# Patient Record
Sex: Male | Born: 1950 | Race: White | Hispanic: No | Marital: Single | Smoking: Never smoker
Health system: Southern US, Community
[De-identification: ages and names within clinical notes are randomized; demographics above are authoritative.]

## PROBLEM LIST (undated history)

## (undated) DIAGNOSIS — J939 Pneumothorax, unspecified: Secondary | ICD-10-CM

## (undated) DIAGNOSIS — C801 Malignant (primary) neoplasm, unspecified: Secondary | ICD-10-CM

## (undated) DIAGNOSIS — I82409 Acute embolism and thrombosis of unspecified deep veins of unspecified lower extremity: Secondary | ICD-10-CM

## (undated) HISTORY — PX: OTHER SURGICAL HISTORY: SHX169

## (undated) HISTORY — DX: Pneumothorax, unspecified: J93.9

## (undated) HISTORY — PX: CYSTOSCOPY: SUR368

## (undated) HISTORY — DX: Malignant (primary) neoplasm, unspecified: C80.1

## (undated) HISTORY — PX: BLADDER TUMOR EXCISION: SHX238

## (undated) HISTORY — DX: Acute embolism and thrombosis of unspecified deep veins of unspecified lower extremity: I82.409

---

## 1975-04-23 DIAGNOSIS — J939 Pneumothorax, unspecified: Secondary | ICD-10-CM

## 1975-04-23 HISTORY — DX: Pneumothorax, unspecified: J93.9

## 1999-12-06 ENCOUNTER — Encounter: Admission: RE | Admit: 1999-12-06 | Discharge: 1999-12-06 | Payer: Self-pay | Admitting: Sports Medicine

## 1999-12-21 ENCOUNTER — Encounter: Admission: RE | Admit: 1999-12-21 | Discharge: 1999-12-21 | Payer: Self-pay | Admitting: Family Medicine

## 2000-01-07 ENCOUNTER — Encounter: Admission: RE | Admit: 2000-01-07 | Discharge: 2000-01-07 | Payer: Self-pay | Admitting: Sports Medicine

## 2000-05-23 ENCOUNTER — Encounter: Admission: RE | Admit: 2000-05-23 | Discharge: 2000-05-23 | Payer: Self-pay | Admitting: Sports Medicine

## 2000-05-27 ENCOUNTER — Encounter: Payer: Self-pay | Admitting: Sports Medicine

## 2000-05-27 ENCOUNTER — Encounter: Admission: RE | Admit: 2000-05-27 | Discharge: 2000-05-27 | Payer: Self-pay | Admitting: Sports Medicine

## 2000-08-18 ENCOUNTER — Encounter: Admission: RE | Admit: 2000-08-18 | Discharge: 2000-08-18 | Payer: Self-pay | Admitting: Sports Medicine

## 2002-09-24 ENCOUNTER — Encounter: Admission: RE | Admit: 2002-09-24 | Discharge: 2002-09-24 | Payer: Self-pay | Admitting: Family Medicine

## 2002-09-24 ENCOUNTER — Encounter: Payer: Self-pay | Admitting: Family Medicine

## 2003-04-19 ENCOUNTER — Emergency Department (HOSPITAL_COMMUNITY): Admission: EM | Admit: 2003-04-19 | Discharge: 2003-04-20 | Payer: Self-pay | Admitting: Emergency Medicine

## 2005-09-25 ENCOUNTER — Ambulatory Visit: Payer: Self-pay | Admitting: Internal Medicine

## 2005-10-07 ENCOUNTER — Ambulatory Visit: Payer: Self-pay | Admitting: Gastroenterology

## 2006-12-19 ENCOUNTER — Ambulatory Visit: Payer: Self-pay | Admitting: Sports Medicine

## 2006-12-19 DIAGNOSIS — K219 Gastro-esophageal reflux disease without esophagitis: Secondary | ICD-10-CM

## 2006-12-19 DIAGNOSIS — M7750 Other enthesopathy of unspecified foot: Secondary | ICD-10-CM | POA: Insufficient documentation

## 2007-04-08 ENCOUNTER — Ambulatory Visit: Payer: Self-pay | Admitting: Sports Medicine

## 2007-04-08 DIAGNOSIS — R5383 Other fatigue: Secondary | ICD-10-CM

## 2007-04-08 DIAGNOSIS — R5381 Other malaise: Secondary | ICD-10-CM

## 2007-04-08 DIAGNOSIS — J392 Other diseases of pharynx: Secondary | ICD-10-CM | POA: Insufficient documentation

## 2007-05-19 ENCOUNTER — Ambulatory Visit: Payer: Self-pay | Admitting: Sports Medicine

## 2007-07-03 ENCOUNTER — Ambulatory Visit: Payer: Self-pay | Admitting: Sports Medicine

## 2007-07-03 DIAGNOSIS — J301 Allergic rhinitis due to pollen: Secondary | ICD-10-CM

## 2007-07-21 ENCOUNTER — Ambulatory Visit: Payer: Self-pay | Admitting: Family Medicine

## 2007-07-21 ENCOUNTER — Ambulatory Visit (HOSPITAL_COMMUNITY): Admission: RE | Admit: 2007-07-21 | Discharge: 2007-07-21 | Payer: Self-pay | Admitting: Sports Medicine

## 2008-01-26 ENCOUNTER — Encounter: Admission: RE | Admit: 2008-01-26 | Discharge: 2008-01-26 | Payer: Self-pay | Admitting: Family Medicine

## 2010-01-23 ENCOUNTER — Ambulatory Visit: Payer: Self-pay | Admitting: Sports Medicine

## 2010-01-23 DIAGNOSIS — S838X9A Sprain of other specified parts of unspecified knee, initial encounter: Secondary | ICD-10-CM | POA: Insufficient documentation

## 2010-01-23 DIAGNOSIS — S86819A Strain of other muscle(s) and tendon(s) at lower leg level, unspecified leg, initial encounter: Secondary | ICD-10-CM

## 2010-05-22 NOTE — Assessment & Plan Note (Signed)
Summary: PULLED CALF MUSCLE,MC   Vital Signs:  Patient profile:   60 year old male Pulse rate:   58 / minute BP sitting:   119 / 75  (left arm)  Vitals Entered By: Rochele Pages RN (January 23, 2010 3:07 PM) CC: rt calf pain x 3.5 weeks   CC:  rt calf pain x 3.5 weeks.  History of Present Illness: 60yo male runner to office with c/o R calf pain x 3-4 weeks. Denies injury or trauma.  Denies feeling a pop or tearing sensation. No significant swelling or bruising. Has been limping at times b/c of pain. Was running 15 miles/wk when pain started, denies running any hills prior to injury.  No running in past 2-3 days b/c of pain.  Has been able to use stationary bike without pain. Taking advil as needed which helps. Denies any numbness or tingling.  Denies any previous calf injuries. Typically runs several 5k & 10k races.   Allergies: No Known Drug Allergies  Review of Systems      See HPI  Physical Exam  General:  Well-developed,well-nourished,in no acute distress; alert,appropriate and cooperative throughout examination Msk:  Right lower leg normal appearing without any deformity or atrophy.  No swelling or echymosis.  No TTP along gastroc or achilles.  No pain over calcaneous.  Normal ankle ROM without pain.  Normal ankle strength & stability.  Able to heel raise & toe walk without difficulty.  Thompson test reproduces plantar flexion.    FEET: arches well preserved.  No significant callous formation.  No TTP along PF or achilles bilaterally.  GAIT: no leg length difference.  Normal walking gait without a limp.  Good running form without limp. Pulses:  +2/4 lower ext b/l Neurologic:  sensation intact to light touch.   Additional Exam:  MSK U/S:  Exam of Rt calf reveals normal appearing gastroc without any edema or signs of tear.  Normal appearing achilles.  No other abnormalities noted.     Impression & Recommendations:  Problem # 1:  MUSCLE STRAIN, RIGHT CALF (ICD-844.8) -  Fitted with Body Helix calf sleeve to wear with activity and while working. - Heel lift placed in shoe to relieve stress on calf. - Educated on HEP with eccentric calf exercises - 3 sets of 15 - Ok to continue with stationary bike.  Can return to running as long as not having significant pain or limping. - Ice area after workouts - Offered Rx for NSAID, but pt prefers to cont. using Advil - f/u 3-4 weeks for re-evaluation, may return sooner if needed.  Orders: Garment,belt,sleeve or other covering ,elastic or similar stretch ([id number]) Foot Orthosis ( Arch Strap/Heel Cup) [phone])  Complete Medication List: 1)  Fluticasone Propionate 50 Mcg/act Susp (Fluticasone propionate) .... 2 sprays each nostril daily

## 2012-04-28 ENCOUNTER — Ambulatory Visit (INDEPENDENT_AMBULATORY_CARE_PROVIDER_SITE_OTHER): Payer: 59 | Admitting: Sports Medicine

## 2012-04-28 ENCOUNTER — Encounter: Payer: Self-pay | Admitting: Sports Medicine

## 2012-04-28 VITALS — BP 100/60 | Ht 70.0 in | Wt 152.0 lb

## 2012-04-28 DIAGNOSIS — IMO0002 Reserved for concepts with insufficient information to code with codable children: Secondary | ICD-10-CM

## 2012-04-28 DIAGNOSIS — S76319A Strain of muscle, fascia and tendon of the posterior muscle group at thigh level, unspecified thigh, initial encounter: Secondary | ICD-10-CM

## 2012-04-28 DIAGNOSIS — R319 Hematuria, unspecified: Secondary | ICD-10-CM | POA: Insufficient documentation

## 2012-04-28 NOTE — Assessment & Plan Note (Addendum)
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  This is not clearly just related to running and he has had several recurrences

## 2012-04-28 NOTE — Patient Instructions (Addendum)
It was good to see you today! Do the exercises we gave you today every day.  If you are not significantly better in 4 weeks, come back to see [place]. Urologist APPT: DR Berneice Heinrich [address] AT 145PM [phone]

## 2012-04-28 NOTE — Assessment & Plan Note (Addendum)
Presumptive diagnosis, likely mostly healed due to month of inactivity.  Will rx hamstring exercise program and compression with RTC 4 weeks if not improving  Given HEP with videos and handouts  Use compression for running or walking.

## 2012-04-28 NOTE — Progress Notes (Signed)
Subjective: The patient is a 62 y.o. year old male who presents today for left buttock pain.  Pain began about 6 months ago while running, although no defined injury.  Pain is located in low left buttock and is made worse by running, biking, or sitting.  No radiation.  Never had something like this before.  He was running more preparing for senior games.  Had gotten over most of his RT Achilles pain when the left buttocks pain started  Patient also reports that over the past several months he has had occasional problems with gross hematuria following runs.  This has happened 3-4 times and is new this year.  Denies any other urinary symptoms.  Patient's past medical, social, and family history were reviewed and updated as appropriate. History  Substance Use Topics  . Smoking status: Not on file  . Smokeless tobacco: Not on file  . Alcohol Use: Not on file   Objective:  Filed Vitals:   04/28/12 1424  BP: 100/60   Gen: NAD, thin, well muscled Left Hip: ROM IR: 80 Deg, ER: 80 Deg, Flexion: 120 Deg, Extension: 100 Deg, Abduction: 45 Deg, Adduction: 45 Deg Strength IR: 5/5, ER: 5/5, Flexion: 5/5, Extension: 5/5, Abduction: 5/5, Adduction: 5/5 Pelvic alignment unremarkable to inspection and palpation. Standing hip rotation and gait without trendelenburg / unsteadiness. Greater trochanter without tenderness to palpation. No tenderness over piriformis and greater trochanter. No SI joint tenderness and normal minimal SI movement. Mild tenderness over ischial tuberosity.  Patient's gait is slightly uneven with left push off slightly delayed.  No weakness or pain with strength testing of hamstring.  Assessment/Plan:  Please also see individual problems in problem list for problem-specific plans.

## 2012-05-22 ENCOUNTER — Other Ambulatory Visit: Payer: Self-pay | Admitting: Urology

## 2012-05-27 ENCOUNTER — Encounter (HOSPITAL_COMMUNITY): Payer: Self-pay | Admitting: Pharmacy Technician

## 2012-05-28 ENCOUNTER — Ambulatory Visit: Payer: 59 | Admitting: Sports Medicine

## 2012-05-28 ENCOUNTER — Inpatient Hospital Stay (HOSPITAL_COMMUNITY): Admission: RE | Admit: 2012-05-28 | Payer: 59 | Source: Ambulatory Visit

## 2012-06-02 ENCOUNTER — Ambulatory Visit (HOSPITAL_COMMUNITY): Admission: RE | Admit: 2012-06-02 | Payer: 59 | Source: Ambulatory Visit | Admitting: Urology

## 2012-06-02 ENCOUNTER — Encounter (HOSPITAL_COMMUNITY): Admission: RE | Payer: Self-pay | Source: Ambulatory Visit

## 2012-06-02 SURGERY — TURBT (TRANSURETHRAL RESECTION OF BLADDER TUMOR)
Anesthesia: General

## 2012-08-17 ENCOUNTER — Ambulatory Visit (INDEPENDENT_AMBULATORY_CARE_PROVIDER_SITE_OTHER): Payer: 59 | Admitting: Sports Medicine

## 2012-08-17 VITALS — Ht 70.0 in | Wt 148.0 lb

## 2012-08-17 DIAGNOSIS — M79609 Pain in unspecified limb: Secondary | ICD-10-CM

## 2012-08-17 DIAGNOSIS — M79661 Pain in right lower leg: Secondary | ICD-10-CM

## 2012-08-18 NOTE — Progress Notes (Signed)
  Subjective:    Patient ID: Donald Schmidt, male    DOB: 06-13-1950, 62 y.o.   MRN: [id number]  HPI chief complaint: Right calf pain  Pleasant 62 year old male comes in today complaining of 3 weeks of right calf pain. Pain began suddenly while walking. He injured this same calf last year. Was treated with compression and a home exercise program with complete resolution of symptoms. He describes a pulling sensation in the area of the medial gastroc but states that the pain at times can be a little more diffuse involving the entire lower leg. He was recently treated for bladder cancer. As part of his treatment he was on Cipro for February 6 through March 21st. he is wondering whether or not that may be playing a role in his current pain. He denies any associated numbness or tingling. His pain resolves immediately with rest.    Review of Systems     Objective:   Physical Exam Well-developed, well-nourished. No acute distress  Right calf: No obvious soft tissue swelling. No ecchymosis. Negative Homans. Mild tenderness to palpation in the mid calf area but no palpable defect. Slight pain with Achilles stretch. Achilles is intact. He does have palpable dorsalis pedis and posterior tibial pulses but the skin in the distal lower leg and ankle is somewhat shiny and hairless suggesting possible peripheral artery disease. Walks without a limp.  MSK ultrasound of the right calf: There is a mild amount of fluid along the midportion of the soleus but I see no discrete tear of the gastrocnemius nor the soleus muscle.       Assessment & Plan:  1. Right lower leg pain likely secondary to calf strain  The patient was on fluoroquinolones for several weeks and I do think that is playing a role in his current injury. It is well-known that fluoroquinolones can have an adverse effect on both tendon and muscle. I've given him a body helix compression sleeve and he is well-versed in the [place] heel drop  exercises. I've also given him a 5/16 inch heel lift (I've given him one for both shoes). Patient will followup with me in 3 weeks. If symptoms persist I may want to consider ABIs to rule out vascular claudication.

## 2012-08-20 ENCOUNTER — Encounter: Payer: Self-pay | Admitting: *Deleted

## 2012-08-20 NOTE — Patient Instructions (Signed)
PT REQUEST TO BE REFERRED FOR A SECOND OPINION. APPT WITH DR DALDORF MON MAY 12TH AT 415PM [address] [place] [phone]

## 2012-08-27 ENCOUNTER — Other Ambulatory Visit: Payer: Self-pay | Admitting: *Deleted

## 2012-08-27 DIAGNOSIS — M79609 Pain in unspecified limb: Secondary | ICD-10-CM

## 2012-09-07 ENCOUNTER — Ambulatory Visit: Payer: 59 | Admitting: Sports Medicine

## 2012-09-15 ENCOUNTER — Encounter: Payer: Self-pay | Admitting: Vascular Surgery

## 2012-09-16 ENCOUNTER — Encounter (INDEPENDENT_AMBULATORY_CARE_PROVIDER_SITE_OTHER): Payer: 59 | Admitting: *Deleted

## 2012-09-16 ENCOUNTER — Ambulatory Visit (INDEPENDENT_AMBULATORY_CARE_PROVIDER_SITE_OTHER): Payer: 59 | Admitting: Vascular Surgery

## 2012-09-16 ENCOUNTER — Encounter: Payer: Self-pay | Admitting: Vascular Surgery

## 2012-09-16 ENCOUNTER — Other Ambulatory Visit: Payer: Self-pay | Admitting: *Deleted

## 2012-09-16 VITALS — BP 117/69 | HR 52 | Resp 16 | Ht 70.5 in | Wt 143.4 lb

## 2012-09-16 DIAGNOSIS — I70219 Atherosclerosis of native arteries of extremities with intermittent claudication, unspecified extremity: Secondary | ICD-10-CM | POA: Insufficient documentation

## 2012-09-16 DIAGNOSIS — I739 Peripheral vascular disease, unspecified: Secondary | ICD-10-CM

## 2012-09-16 DIAGNOSIS — M79609 Pain in unspecified limb: Secondary | ICD-10-CM

## 2012-09-16 MED ORDER — NAPROXEN 250 MG PO TABS: 250.0000 mg | ORAL_TABLET | Freq: Two times a day (BID) | ORAL | Status: DC

## 2012-09-16 NOTE — Progress Notes (Signed)
[facility] [place]  Referred by:  Dr. Kirby Funk  Reason for referral: R calf claudication  History of Present Illness  Donald Schmidt is a 62 y.o. (09-Apr-1951) male w/ bladder cancer who presents with chief complaint: R calf pain.  Onset of symptom occurred, one year ago when he was found to have a calf injury.  He recovered from that and resumed running.  Over last couple of months, he has been have increased cramping in R calf with walking only 150 ft.  Pain is described as cramping, severity 3-6/10, and associated with walking.  Patient has attempted to treat this pain with rest and Ibuprofen.  The patient has not rest pain symptoms also and no leg wounds/ulcers.  Atherosclerotic risk factors include: none.  Past Medical History  Diagnosis Date  . Cancer     BLADDER    Past Surgical History  Procedure Laterality Date  . Bladder tumor excision      History   Social History  . Marital Status: Single    Spouse Name: N/A    Number of Children: N/A  . Years of Education: N/A   Occupational History  . Not on file.   Social History Main Topics  . Smoking status: Never Smoker   . Smokeless tobacco: Never Used  . Alcohol Use: Yes  . Drug Use: No  . Sexually Active: Not on file   Other Topics Concern  . Not on file   Social History Narrative  . No narrative on file    FamHx Parent have not contributing active medical problems  Current Outpatient Prescriptions on File Prior to Visit  Medication Sig Dispense Refill  . methocarbamol (ROBAXIN) 500 MG tablet Take 500 mg by mouth 3 (three) times daily.      [facility] CALCIUM 500 PO) Take 500 mg by mouth daily.       No current facility-administered medications on file prior to visit.    Allergies  Allergen Reactions  . Penicillins Rash     REVIEW OF SYSTEMS:  (Positives checked otherwise negative)  CARDIOVASCULAR:  [ ]  chest pain, [ ]  chest pressure, [ ]  palpitations, [ ]   shortness of breath when laying flat, [ ]  shortness of breath with exertion,   [x]  pain in feet when walking, [ ]  pain in feet when laying flat, [ ]  history of blood clot in veins (DVT), [ ]  history of phlebitis, [ ]  swelling in legs, [ ]  varicose veins  PULMONARY:  [ ]  productive cough, [ ]  asthma, [ ]  wheezing  NEUROLOGIC:  [ ]  weakness in arms or legs, [ ]  numbness in arms or legs, [ ]  difficulty speaking or slurred speech, [ ]  temporary loss of vision in one eye, [x]  dizziness  HEMATOLOGIC:  [x]  bleeding problems, [ ]  problems with blood clotting too easily  MUSCULOSKEL:  [ ]  joint pain, [ ]  joint swelling  GASTROINTEST:  [ ]   Vomiting blood, [ ]   Blood in stool     GENITOURINARY:  [ ]   Burning with urination, [x]   Blood in urine: related to bladder stent  PSYCHIATRIC:  [ ]  history of major depression  INTEGUMENTARY:  [ ]  rashes, [ ]  ulcers  CONSTITUTIONAL:  [ ]  fever, [ ]  chills  Physical Examination  Filed Vitals:   09/16/12 1400  BP: 117/69  Pulse: 52  Resp: 16  Height: 5' 10.5" (1.791 m)  Weight: 143 lb 6.4 oz (65.046 kg)  Body mass index is 20.28 kg/(m^2).  General: A&O x 3, WDWN  Head: [place]/AT  Ear/Nose/Throat: Hearing grossly intact, nares w/o erythema or drainage, oropharynx w/o Erythema/Exudate  Eyes: PERRLA, EOMI  Neck: Supple, no nuchal rigidity, no palpable LAD  Pulmonary: Sym exp, good air movt, CTAB, no rales, rhonchi, & wheezing  Cardiac: RRR, Nl S1, S2, no Murmurs, rubs or gallops  Vascular: Vessel Right Left  Radial Palpable Palpable  Ulnar Palpable Palpable  Brachial Palpable Palpable  Carotid Palpable, without bruit Palpable, without bruit  Aorta Not palpable N/A  Femoral Palpable Palpable  Popliteal Not palpable Palpable  PT Not Palpable Palpable  DP Not Palpable Palpable   Gastrointestinal: soft, NTND, -G/R, - HSM, - masses, - CVAT B, no AAA palpable  Musculoskeletal: M/S 5/5 throughout , Extremities without ischemic changes    Neurologic: CN 2-12 intact , Pain and light touch intact in extremities , Motor exam as listed above  Psychiatric: Judgment intact, Mood & affect appropriatefor pt's clinical situation  Dermatologic: See M/S exam for extremity exam, no rashes otherwise noted  Lymph : No Cervical, Axillary, or Inguinal lymphadenopathy   Non-Invasive Vascular Imaging  ABI (Date: 09/16/12)  RLE: 0.70, DP: mono, PT: mono, TBI no obtained due to pt movement  LLE: 1.13, DP: bi, PT: tri, TBI 0.49  R arterial duplex (Date: 09/16/12)  Patent B SFA with extensive diffuse calcific plaque  Occluded R popliteal artery  Only R PT visualized  L PT and peroneal patent  Outside Studies/Documentation 3 pages of outside documents were reviewed including: outside [facility] chart.  Medical Decision Making  [name] is a 62 y.o. male who presents with: RLE short distance lifestyle limiting intermittent claudication, BLE PAD   Given this patient's limited co-morbidities, it is a bit unusual for him to have significant BLE PAD.  He doesn't have sx and signs of popliteal entrapment, cystic adventital disease, and any vasculitis.  So I'm not certain the etiology of his R leg popliteal occlusion, this raises the concern for possible embolic disease in the setting of known PAD.  I discussed with the patient the natural history of intermittent claudication: 75% of patients have stable or improved symptoms in a year an only 2% require amputation. Eventually 20% may require intervention in a year.  I discussed in depth with the patient the nature of atherosclerosis, and emphasized the importance of maximal medical management including strict control of blood pressure, blood glucose, and lipid levels, antiplatelet agent, obtaining regular exercise, and cessation of smoking.    The patient is aware that without maximal medical management the underlying atherosclerotic disease process will progress, limiting the benefit  of any interventions.  I discussed in depth with the patient a walking plan and how to execute such.  Given the lifestyle limiting nature of his disease, I offered the patient: aortogram, bilateral leg runoff, and possible R leg intevention I discussed with the patient the nature of angiographic procedures, especially the limited patencies of any endovascular intervention.   The patient is aware of that the risks of an angiographic procedure include but are not limited to: bleeding, infection, access site complications, renal failure, embolization, rupture of vessel, dissection, possible need for emergent surgical intervention, possible need for surgical procedures to treat the patient's pathology, and stroke and death.   The patient is aware of the risks and is going to thinking it over.   I have him scheduled for repeat ABI in 6 months, otherwise.  Thank you for  allowing [place] to participate in this patient's care.  Leonides Sake, MD [facility] [place] Office: [phone] Pager: [phone]  09/16/2012, 6:13 PM

## 2012-09-17 ENCOUNTER — Encounter: Payer: Self-pay | Admitting: Vascular Surgery

## 2012-09-17 NOTE — Addendum Note (Signed)
Addended by: Adria Dill L on: 09/17/2012 02:41 PM   Modules accepted: Orders

## 2012-09-21 ENCOUNTER — Ambulatory Visit (INDEPENDENT_AMBULATORY_CARE_PROVIDER_SITE_OTHER): Payer: 59 | Admitting: Cardiovascular Disease

## 2012-09-21 ENCOUNTER — Encounter: Payer: Self-pay | Admitting: *Deleted

## 2012-09-21 ENCOUNTER — Telehealth: Payer: Self-pay | Admitting: Vascular Surgery

## 2012-09-21 ENCOUNTER — Encounter: Payer: Self-pay | Admitting: Cardiovascular Disease

## 2012-09-21 ENCOUNTER — Telehealth: Payer: Self-pay

## 2012-09-21 VITALS — BP 110/70 | HR 59 | Ht 70.5 in | Wt 155.5 lb

## 2012-09-21 DIAGNOSIS — I499 Cardiac arrhythmia, unspecified: Secondary | ICD-10-CM

## 2012-09-21 DIAGNOSIS — I4891 Unspecified atrial fibrillation: Secondary | ICD-10-CM

## 2012-09-21 DIAGNOSIS — I70201 Unspecified atherosclerosis of native arteries of extremities, right leg: Secondary | ICD-10-CM | POA: Insufficient documentation

## 2012-09-21 DIAGNOSIS — I743 Embolism and thrombosis of arteries of the lower extremities: Secondary | ICD-10-CM

## 2012-09-21 DIAGNOSIS — I749 Embolism and thrombosis of unspecified artery: Secondary | ICD-10-CM

## 2012-09-21 NOTE — Telephone Encounter (Signed)
Message copied by [facility], Sharine Cadle E on Mon Sep 21, 2012 11:05 AM ------      Message from: Lorine Bears A      Created: Mon Sep 21, 2012 10:40 AM      Regarding: Hematology       Refer him to hematology.       Either Dr. Arlan Organ (office in [facility])      Or Dr. Eli Hose (office in [place]).  ------

## 2012-09-21 NOTE — Telephone Encounter (Signed)
See below

## 2012-09-21 NOTE — Telephone Encounter (Signed)
Patient's questions were answered to his satisfaction.  He is still think over the possibility of right leg angiogram.

## 2012-09-21 NOTE — Patient Instructions (Addendum)
Your physician has requested that you have an echocardiogram. Echocardiography is a painless test that uses sound waves to create images of your heart. It provides your doctor with information about the size and shape of your heart and how well your heart's chambers and valves are working. This procedure takes approximately one hour. There are no restrictions for this procedure.  Your physician has recommended that you wear an event monitor. Event monitors are medical devices that record the heart's electrical activity. Doctors most often [place] these monitors to diagnose arrhythmias. Arrhythmias are problems with the speed or rhythm of the heartbeat. The monitor is a small, portable device. You can wear one while you do your normal daily activities. This is usually used to diagnose what is causing palpitations/syncope (passing out).- This will be mailed to your home from [facility].  We will call you with name of hematologist.

## 2012-09-21 NOTE — Progress Notes (Signed)
Primary care physician: Dr. Kirby Funk  HPI  This is a pleasant 62 year old man who was referred by Dr. Wyn Quaker for evaluation of cardiac source of embolism. The patient has been healthy throughout his life and very athletic. He has no history of diabetes, tobacco use, hypertension or hyperlipidemia. He used to run 5K on a regular basis. He was diagnosed with bladder cancer early this year and was treated with surgery in February. About 5-6 weeks after that he noticed this right calf discomfort with walking. The discomfort is currently happening with about 200 feet. This forces him to stop and rest. He went and saw Dr. Imogene Burn for vascular consultation. He was found to have an occluded right popliteal artery with an ABI of 0.7 on the right side and normal on the left side. He went for a second opinion and saw Dr. Wyn Quaker. Given the lack of significant risk factors for atherosclerosis, the patient was referred for evaluation of cardiac source of embolism. He also reported symptoms of intermittent dizziness and lightheadedness associated with occasional palpitations. He had an episode a few weeks ago when he had numbness involving the left side of his face and around his lips. He has no previous history of stroke. He denies chest pain or dyspnea. There is no family history of hypercoagulable state. His grandfather had abdominal aortic aneurysm.   Allergies  Allergen Reactions  . Penicillins Rash     Current Outpatient Prescriptions on File Prior to Visit  Medication Sig Dispense Refill  . Calcium 200 MG TABS Take 200 mg by mouth daily.       No current facility-administered medications on file prior to visit.     Past Medical History  Diagnosis Date  . Cancer     BLADDER  . Pneumothorax 1977  . DVT (deep venous thrombosis)      Past Surgical History  Procedure Laterality Date  . Bladder tumor excision    . Collasped lung    . Cystoscopy       Family History  Problem Relation Age of  Onset  . Hypertension Mother   . Hypertension Father   . Arrhythmia Father      History   Social History  . Marital Status: Single    Spouse Name: N/A    Number of Children: N/A  . Years of Education: N/A   Occupational History  . Not on file.   Social History Main Topics  . Smoking status: Never Smoker   . Smokeless tobacco: Never Used  . Alcohol Use: 0.6 oz/week    1 Cans of beer per week     Comment: daily  . Drug Use: No  . Sexually Active: Not on file   Other Topics Concern  . Not on file   Social History Narrative  . No narrative on file     ROS Constitutional: Negative for fever, chills, diaphoresis, activity change, appetite change and fatigue.  HENT: Negative for hearing loss, nosebleeds, congestion, sore throat, facial swelling, drooling, trouble swallowing, neck pain, voice change, sinus pressure and tinnitus.  Eyes: Negative for photophobia, pain, discharge and visual disturbance.  Respiratory: Negative for apnea, cough, chest tightness, shortness of breath and wheezing.  Cardiovascular: Negative for chest pain.  Gastrointestinal: Negative for nausea, vomiting, abdominal pain, diarrhea, constipation, blood in stool and abdominal distention.  Genitourinary: Negative for dysuria, urgency, frequency, hematuria and decreased urine volume.  Skin: Negative for color change, pallor, rash and wound.  Neurological: Negative for dizziness, tremors,  seizures, syncope, speech difficulty, weakness, light-headedness, numbness and headaches.  Psychiatric/Behavioral: Negative for suicidal ideas, hallucinations, behavioral problems and agitation. The patient is not nervous/anxious.     PHYSICAL EXAM   BP 110/70  Pulse 59  Ht 5' 10.5" (1.791 m)  Wt 155 lb 8 oz (70.534 kg)  BMI 21.99 kg/m2 Constitutional: He is oriented to person, place, and time. He appears well-developed and well-nourished. No distress.  HENT: No nasal discharge.  Head: Normocephalic and  atraumatic.  Eyes: Pupils are equal and round. Right eye exhibits no discharge. Left eye exhibits no discharge.  Neck: Normal range of motion. Neck supple. No JVD present. No thyromegaly present.  Cardiovascular: Normal rate, regular rhythm, normal heart sounds and. Exam reveals no gallop and no friction rub. No murmur heard.  Pulmonary/Chest: Effort normal and breath sounds normal. No stridor. No respiratory distress. He has no wheezes. He has no rales. He exhibits no tenderness.  Abdominal: Soft. Bowel sounds are normal. He exhibits no distension. There is no tenderness. There is no rebound and no guarding.  Musculoskeletal: Normal range of motion. He exhibits no edema and no tenderness.  Neurological: He is alert and oriented to person, place, and time. Coordination normal.  Skin: Skin is warm and dry. No rash noted. He is not diaphoretic. No erythema. No pallor.  Psychiatric: He has a normal mood and affect. His behavior is normal. Judgment and thought content normal.       ZOX:WRUEA  Bradycardia  -Prominent R(V1) -nonspecific.   BORDERLINE   ASSESSMENT AND PLAN

## 2012-09-21 NOTE — Assessment & Plan Note (Signed)
The patient has what seems to be an isolated right popliteal artery occlusion. He has no significant risk factors for atherosclerosis. Thus, I think we have to evaluate for an alternative etiology. I agree that the most likely etiology is thromboembolism from another source. Less likely to be in situ thrombosis. He did have recent bladder cancer which was treated by surgery and might be playing a role. With his recent reported symptoms of left face numbness and occasional dizziness, I definitely agree that we have to evaluate him for possible underlying paroxysmal atrial fibrillation. He is currently in normal sinus rhythm. I will obtain a 30 day outpatient telemetry to evaluate this. I will also schedule him for an echocardiogram to evaluate for intracardiac thrombus. It might be also worth to perform CT angiogram of the aorta with runoff to evaluate for a source of embolism from the aorta itself. The other possibility is hypercoagulable state leading to arterial thrombosis. I recommend hematology evaluation for this.  In the meantime, continue treatment with Plavix.

## 2012-09-22 ENCOUNTER — Other Ambulatory Visit: Payer: Self-pay

## 2012-09-22 ENCOUNTER — Other Ambulatory Visit (INDEPENDENT_AMBULATORY_CARE_PROVIDER_SITE_OTHER): Payer: 59

## 2012-09-22 DIAGNOSIS — I749 Embolism and thrombosis of unspecified artery: Secondary | ICD-10-CM

## 2012-09-22 NOTE — Telephone Encounter (Signed)
LMTCB at Dr. Alver Fisher office re: new pt appt

## 2012-09-23 NOTE — Progress Notes (Signed)
Pt informed of normal echo result. 

## 2012-09-23 NOTE — Progress Notes (Signed)
lmtcb

## 2012-09-29 ENCOUNTER — Ambulatory Visit: Payer: Self-pay | Admitting: Vascular Surgery

## 2012-09-30 ENCOUNTER — Other Ambulatory Visit: Payer: Self-pay

## 2012-09-30 DIAGNOSIS — I743 Embolism and thrombosis of arteries of the lower extremities: Secondary | ICD-10-CM

## 2012-09-30 DIAGNOSIS — D6859 Other primary thrombophilia: Secondary | ICD-10-CM

## 2012-09-30 NOTE — Telephone Encounter (Signed)
Per Tiffany at Dr. Alona Bene office they will call to schedule pt off of order

## 2012-09-30 NOTE — Telephone Encounter (Signed)
Attempted to call Dr. Alona Bene office again Line busy Will try again later

## 2012-09-30 NOTE — Telephone Encounter (Signed)
Staff message sent to Dr. Lorrin Mais Order placed for hematology referral

## 2012-10-05 ENCOUNTER — Ambulatory Visit: Payer: Self-pay | Admitting: Oncology

## 2012-10-06 ENCOUNTER — Telehealth: Payer: Self-pay | Admitting: *Deleted

## 2012-10-06 NOTE — Telephone Encounter (Signed)
Patient call re: heart monitor being mailed to his house...he was checking the status of it. Thanks

## 2012-10-07 NOTE — Telephone Encounter (Signed)
Pt called back again to today to discuss his holter monitor. Please call

## 2012-10-08 NOTE — Telephone Encounter (Signed)
Notified patient regarding his monitor. The patient was told by [facility] he would receive the monitor in two business days from yesterday. The patient is to call our office if he does not receive the monitor.

## 2012-10-09 DIAGNOSIS — I4891 Unspecified atrial fibrillation: Secondary | ICD-10-CM

## 2012-10-19 ENCOUNTER — Encounter: Payer: 59 | Admitting: Surgery

## 2012-10-20 ENCOUNTER — Ambulatory Visit: Payer: Self-pay | Admitting: Oncology

## 2012-10-21 ENCOUNTER — Telehealth: Payer: Self-pay

## 2012-10-21 NOTE — Telephone Encounter (Signed)
Pt currently has monitor on, pt would like to know if he can get the results so far from this. States he is having vascular procedure by dr dew next week and would like to know results. Please call

## 2012-10-22 NOTE — Telephone Encounter (Signed)
LMOM re:Dr. Arida's response

## 2012-10-22 NOTE — Telephone Encounter (Signed)
lmtcb

## 2012-10-22 NOTE — Telephone Encounter (Signed)
Pt is returning your call

## 2012-10-22 NOTE — Telephone Encounter (Signed)
Please advise We only have 2 tracings thus far(both baseline NSR) Ends 7/20 Should I advise him to postpone surg?

## 2012-10-22 NOTE — Telephone Encounter (Signed)
No need to postpone surgery. NSR so far.

## 2012-10-29 ENCOUNTER — Ambulatory Visit: Payer: Self-pay | Admitting: Vascular Surgery

## 2012-10-29 LAB — BUN: BUN: 15 mg/dL (ref 7–18)

## 2012-10-29 LAB — CREATININE, SERUM
EGFR (African American): >60
EGFR (Non-African Amer.): >60

## 2012-11-20 ENCOUNTER — Ambulatory Visit (INDEPENDENT_AMBULATORY_CARE_PROVIDER_SITE_OTHER): Payer: 59

## 2012-11-20 DIAGNOSIS — I4891 Unspecified atrial fibrillation: Secondary | ICD-10-CM

## 2012-11-20 NOTE — Addendum Note (Signed)
Addended by: Festus Aloe on: 11/20/2012 04:00 PM   Modules accepted: Level of Service

## 2013-02-25 ENCOUNTER — Other Ambulatory Visit: Payer: Self-pay

## 2013-03-12 ENCOUNTER — Encounter (HOSPITAL_COMMUNITY): Payer: 59

## 2013-03-12 ENCOUNTER — Ambulatory Visit: Payer: 59 | Admitting: Vascular Surgery

## 2014-02-04 ENCOUNTER — Other Ambulatory Visit: Payer: Self-pay

## 2014-08-12 NOTE — Op Note (Signed)
PATIENT NAME:  Donald Schmidt, TRITSCHLER MR#:  [id number] DATE OF BIRTH:  [birth year]  DATE OF PROCEDURE:  10/29/2012  PREOPERATIVE DIAGNOSES: 1.  Peripheral arterial disease with claudication, right lower extremity.  2.  Possible embolization to right lower extremity.  3.  Bladder tumor.   POSTOPERATIVE DIAGNOSES: 1.  Peripheral arterial disease with claudication, right lower extremity.  2.  Possible embolization to right lower extremity.  3.  Bladder tumor.   PROCEDURE:  1.  Catheter placement to right peroneal artery from left femoral approach.  2.  Aortogram and selective right lower extremity angiogram.  3.  Percutaneous transluminal angioplasty of proximal superficial femoral artery with 5 mm diameter angioplasty balloon.  4.  Percutaneous transluminal angioplasty of long segment peroneal artery with 3 mm diameter angioplasty balloon.  5.  Percutaneous transluminal angioplasty of proximal peroneal artery, tibioperoneal trunk, distal popliteal artery with 4 mm diameter angioplasty balloon.  6.  Percutaneous transluminal angioplasty of right popliteal artery with 5 mm diameter angioplasty balloon.  7.  Xpert stent placement placed into the distal popliteal artery, tibioperoneal trunk and proximal peroneal artery using a 5 mm diameter x 6 cm length Xpert stent.  8.  Viabahn stent placement to the right popliteal artery using a 6 mm diameter x 10 cm length Viabahn stent. These were done for both stents were placed for greater than 50% residual stenosis and thrombosis after angioplasty.  9.  StarClose closure device, left femoral artery.   SURGEON: Algernon Huxley, M.D.   ANESTHESIA: Local with moderate conscious sedation.   ESTIMATED BLOOD LOSS: Minimal.   FLUOROSCOPY TIME: Approximately proximally 11 minutes.   CONTRAST: 110 mL.   INDICATION FOR PROCEDURE: A 64 year old white male who presented to me several weeks ago with short distance claudication, right lower extremity. This was very  lifestyle limiting to him, given his very active lifestyle. He runs triathlons and is an avid biker and he can now walk less than 100 yards without having to stop. He is very desirous of intervention. A thorough evaluation for possible cause of embolization was performed. He has an ongoing cardiac monitor. He has had a CT scan looking for thoracic or abdominal aortic aneurysm, which were not present. He has had a hematology evaluation. He has been maintained on Plavix. He is brought in today for angiogram for further evaluation and potential treatment.   DESCRIPTION OF THE PROCEDURE: The patient is brought to the vascular interventional radiology suite. Groins were shaved and prepped and a sterile surgical field was created. The left femoral head was localized with fluoroscopy, and the left femoral artery was accessed without difficulty with a Seldinger needle. A J-wire and 5 French sheath were placed. Pigtail catheter was placed in the aorta at the L1-L2 level and AP arteriogram was performed An RAO projection arteriogram was then performed to evaluate if there is any iliac lesion that could have caused embolization distally. The aorta and iliac as well as renal arteries were widely patent and essentially devoid of any atherosclerotic disease. I then hooked the aortic bifurcation and advanced to the right femoral head and a selective right lower extremity angiogram was then performed. This showed what appeared to be an atherosclerotic lesion in the proximal superficial femoral artery with the 70% to 80% stenosis over a 5 to 6 cm segment. The remainder of the SFA was patent down to the above-knee popliteal artery, which acutely terminated and initially, no runoff could be seen distally. This had the appearance of a nonatherosclerotic lesion  and was more consistent with an embolus, although it could have been a lesion that thrombosed on top of native disease. There was not much calcification or clear  atherosclerotic  changes located in this area and embolization still the likely cause. This has now been chronic over several weeks to months and an attempt at revascularization was performed today. The patient was systemically heparinized 5000 units of intravenous heparin. A 6 French Ansell sheath was placed over with a Terumo advantage wire. I then took a Kumpe catheter and parked this in the distal superficial femoral artery and performed magnified imaging. I still had difficulty seeing any targets distally on our initial imaging. I then passed a wire. The Terumo advantage wire and Kumpe catheter were used. I then  had to exchange for a CXI catheter and was able to gain access to what would be the peroneal artery distally. This had some disease, particularly in the proximal and mid segment and then reconstituted the posterior tibial artery at the level of the ankle through collaterals. I then replaced a 0.018. A wire a 3 mm diameter angioplasty balloon was inflated in the peroneal artery over a long segment. Initially a 5 mm diameter angioplasty balloon was used to treat the popliteal lesion. The proximal SFA lesion, which was separate and distinct and this did appear clearly atherosclerotic, was treated with a 5 mm diameter angioplasty balloon. The proximal superficial femoral artery responded well to angioplasty and had a less than 20% residual stenosis after angioplasty and no flow limitation. However, the popliteal and TP trunk and peroneal still had poor flow. I then took a 4 mm diameter angioplasty balloon in the proximal peroneal artery, tibioperoneal trunk and distal popliteal artery. This seemed to improve flow and some more flow was seen down the peroneal artery, but this was still not brisk. It was clear there was some spasm likely as well and 2 doses of intra-arterial nitroglycerin were given during the course the procedure. I then treated the popliteal lesion aggressively with a 5 mm diameter  angioplasty balloon and inflated to near-burst pressures, but there was still a residual lesion in the above-knee popliteal artery and likely in the distal the re-entry point at the tibioperoneal trunk. There was a flow limiting lesion. It was not entirely clear if this was an atherosclerotic component to this, but either way we are attempting to restore flow. I used a 5 mm diameter x 6 cm in length Xpert stent in the proximal peroneal artery, tibioperoneal trunk, up to the distal popliteal artery. A 6 mm diameter Viabahn stent was taken across the knee in the popliteal artery to encompass that residual lesion. Following this, there was in-line flow; however, there was still significant spasm and the runoff was not particularly brisk distally. This seemed to improve with wire removal and treatment with intra-arterial nitroglycerin. Our completion angiogram showed in-line flow through and to the termination of the peroneal artery at ankle with reconstitution collaterals. At this point, our flow appeared to be as good as we could possibly make it from percutaneous standpoint and I elected to terminate the procedure. The sheath was pulled back to the ipsilateral external iliac artery and oblique arteriogram was performed. StarClose closure device was deployed in the usual fashion with excellent hemostatic result. The patient tolerated the procedure well and was taken to the recovery room in stable condition.     ____________________________ Algernon Huxley, MD jsd:cc D: 10/29/2012 15:27:06 ET T: 10/29/2012 23:07:25 ET JOB#: [id number]  cc: Algernon Huxley, MD, <Dictator> Dr. Lavone Orn Algernon Huxley MD ELECTRONICALLY SIGNED 11/05/2012 9:50

## 2014-10-17 ENCOUNTER — Other Ambulatory Visit: Payer: Self-pay

## 2015-03-15 ENCOUNTER — Ambulatory Visit (INDEPENDENT_AMBULATORY_CARE_PROVIDER_SITE_OTHER): Payer: Medicare Other | Admitting: Emergency Medicine

## 2015-03-15 VITALS — BP 128/64 | HR 66 | Temp 98.3°F | Resp 16 | Ht 71.0 in | Wt 154.0 lb

## 2015-03-15 DIAGNOSIS — N400 Enlarged prostate without lower urinary tract symptoms: Secondary | ICD-10-CM

## 2015-03-15 DIAGNOSIS — N3090 Cystitis, unspecified without hematuria: Secondary | ICD-10-CM | POA: Diagnosis not present

## 2015-03-15 LAB — POCT URINALYSIS DIP (MANUAL ENTRY)
Bilirubin, UA: NEGATIVE
GLUCOSE UA: NEGATIVE
Ketones, POC UA: NEGATIVE
NITRITE UA: NEGATIVE
Protein Ur, POC: 30 — AB
Spec Grav, UA: 1.01
Urobilinogen, UA: 0.2
pH, UA: 6.5

## 2015-03-15 LAB — POC MICROSCOPIC URINALYSIS (UMFC): MUCUS RE: ABSENT

## 2015-03-15 MED ORDER — CIPROFLOXACIN HCL 500 MG PO TABS: 500.0000 mg | ORAL_TABLET | Freq: Two times a day (BID) | ORAL | Status: AC

## 2015-03-15 NOTE — Progress Notes (Signed)
Subjective:  Patient ID: Donald Schmidt, male    DOB: 04-27-1950  Age: 64 y.o. MRN: [id number]  CC: Dysuria and Urinary Frequency   HPI Donald Schmidt presents  as a history suggestive of prostatism he was put on Flomax by his urologist and he didn't tolerate the medication really didn't have much improvement in his symptoms with the medication over 12 day. So discontinued it has hesitancy dysuria and dribbling. He has nocturia getting up about every hour and a half. No fever or chills. He'll became alarmed today because he noticed blood clot strength in his urine. He has no urethral discharge. He has a history of bladder cancer and has been under close surveillance for that. He has no fever chills nausea vomiting or stool change  History Donald Schmidt has a past medical history of Cancer (Donald Schmidt); Pneumothorax (1977); and DVT (deep venous thrombosis) (Donald Schmidt).   He has past surgical history that includes Bladder tumor excision; collasped lung; and Cystoscopy.   His  family history includes Arrhythmia in his father; Hypertension in his father and mother.  He   reports that he has never smoked. He has never used smokeless tobacco. He reports that he drinks about 0.6 oz of alcohol per week. He reports that he does not use illicit drugs.  Outpatient Prescriptions Prior to Visit  Medication Sig Dispense Refill  . Calcium 200 MG TABS Take 200 mg by mouth daily.    . clopidogrel (PLAVIX) 75 MG tablet Take 75 mg by mouth daily.      No facility-administered medications prior to visit.    Social History   Social History  . Marital Status: Single    Spouse Name: N/A  . Number of Children: N/A  . Years of Education: N/A   Social History Main Topics  . Smoking status: Never Smoker   . Smokeless tobacco: Never Used  . Alcohol Use: 0.6 oz/week    1 Cans of beer per week     Comment: daily  . Drug Use: No  . Sexual Activity: Not Asked   Other Topics Concern  . None   Social History  Narrative     Review of Systems  Constitutional: Negative for fever, chills and appetite change.  HENT: Negative for congestion, ear pain, postnasal drip, sinus pressure and sore throat.   Eyes: Negative for pain and redness.  Respiratory: Negative for cough, shortness of breath and wheezing.   Cardiovascular: Negative for leg swelling.  Gastrointestinal: Negative for nausea, vomiting, abdominal pain, diarrhea, constipation and blood in stool.  Endocrine: Negative for polyuria.  Genitourinary: Positive for dysuria, urgency, frequency, hematuria and difficulty urinating. Negative for flank pain.  Musculoskeletal: Negative for gait problem.  Skin: Negative for rash.  Neurological: Negative for weakness and headaches.  Psychiatric/Behavioral: Negative for confusion and decreased concentration. The patient is not nervous/anxious.     Objective:  BP 128/64 mmHg  Pulse 66  Temp(Src) 98.3 F (36.8 C)  Resp 16  Ht 5\' 11"  (1.803 m)  Wt 154 lb (69.854 kg)  BMI 21.49 kg/m2  Physical Exam  Constitutional: He is oriented to person, place, and time. He appears well-developed and well-nourished.  HENT:  Head: Normocephalic and atraumatic.  Eyes: Conjunctivae are normal. Pupils are equal, round, and reactive to light.  Pulmonary/Chest: Effort normal.  Musculoskeletal: He exhibits no edema.  Neurological: He is alert and oriented to person, place, and time.  Skin: Skin is dry.  Psychiatric: He has a normal mood and affect.  His behavior is normal. Thought content normal.      Assessment & Plan:   Donald Schmidt was seen today for dysuria and urinary frequency.  Diagnoses and all orders for this visit:  Prostatism  Cystitis -     POCT Microscopic Urinalysis ([facility]) -     POCT urinalysis dipstick  Other orders -     ciprofloxacin (CIPRO) 500 MG tablet; Take 1 tablet (500 mg total) by mouth 2 (two) times daily.   I have discontinued Donald Schmidt's clopidogrel. I am also having him start  on ciprofloxacin. Additionally, I am having him maintain his Calcium, Rosuvastatin Calcium (CRESTOR PO), and co-enzyme Q-10.  Meds ordered this encounter  Medications  . Rosuvastatin Calcium (CRESTOR PO)    Sig: Take by mouth.  . co-enzyme Q-10 30 MG capsule    Sig: Take 30 mg by mouth 3 (three) times daily.  . ciprofloxacin (CIPRO) 500 MG tablet    Sig: Take 1 tablet (500 mg total) by mouth 2 (two) times daily.    Dispense:  20 tablet    Refill:  0   His symptoms certainly suggest a benign prostatic hypertrophy. And I encourage him to continue taking the Flomax on his return to [place]. Due to the hematuria May white cells and urinalysis suggest we start him on an antibiotic for urinary tract infection and encourage and follow-up as soon as possible and return to [place] with his urologist to evaluate hematuria to make sure that he doesn't have recurrence he understands and will comply  Appropriate red flag conditions were discussed with the patient as well as actions that should be taken.  Patient expressed his understanding.  Follow-up: Return if symptoms worsen or fail to improve.  Roselee Culver, MD   Results for orders placed or performed in visit on 03/15/15  POCT Microscopic Urinalysis ([facility])  Result Value Ref Range   WBC,UR,HPF,POC Few (A) None WBC/hpf   RBC,UR,HPF,POC Few (A) None RBC/hpf   Bacteria None None, Too numerous to count   Mucus Absent Absent   Epithelial Cells, UR Per Microscopy Few (A) None, Too numerous to count cells/hpf  POCT urinalysis dipstick  Result Value Ref Range   Color, UA yellow yellow   Clarity, UA clear clear   Glucose, UA negative negative   Bilirubin, UA negative negative   Ketones, POC UA negative negative   Spec Grav, UA 1.010    Blood, UA moderate (A) negative   pH, UA 6.5    Protein Ur, POC =30 (A) negative   Urobilinogen, UA 0.2    Nitrite, UA Negative Negative   Leukocytes, UA small (1+) (A) Negative

## 2015-03-15 NOTE — Patient Instructions (Signed)
Hematuria, Adult Hematuria is blood in your urine. It can be caused by a bladder infection, kidney infection, prostate infection, kidney stone, or cancer of your urinary tract. Infections can usually be treated with medicine, and a kidney stone usually will pass through your urine. If neither of these is the cause of your hematuria, further workup to find out the reason may be needed. It is very important that you tell your health care provider about any blood you see in your urine, even if the blood stops without treatment or happens without causing pain. Blood in your urine that happens and then stops and then happens again can be a symptom of a very serious condition. Also, pain is not a symptom in the initial stages of many urinary cancers. HOME CARE INSTRUCTIONS   Drink lots of fluid, 3-4 quarts a day. If you have been diagnosed with an infection, cranberry juice is especially recommended, in addition to large amounts of water.  Avoid caffeine, tea, and carbonated beverages because they tend to irritate the bladder.  Avoid alcohol because it may irritate the prostate.  Take all medicines as directed by your health care provider.  If you were prescribed an antibiotic medicine, finish it all even if you start to feel better.  If you have been diagnosed with a kidney stone, follow your health care provider's instructions regarding straining your urine to catch the stone.  Empty your bladder often. Avoid holding urine for long periods of time.  After a bowel movement, women should cleanse front to back. Use each tissue only once.  Empty your bladder before and after sexual intercourse if you are a male. SEEK MEDICAL CARE IF:  You develop back pain.  You have a fever.  You have a feeling of sickness in your stomach (nausea) or vomiting.  Your symptoms are not better in 3 days. Return sooner if you are getting worse. SEEK IMMEDIATE MEDICAL CARE IF:   You develop severe vomiting and  are unable to keep the medicine down.  You develop severe back or abdominal pain despite taking your medicines.  You begin passing a large amount of blood or clots in your urine.  You feel extremely weak or faint, or you pass out. MAKE SURE YOU:   Understand these instructions.  Will watch your condition.  Will get help right away if you are not doing well or get worse.   This information is not intended to replace advice given to you by your health care provider. Make sure you discuss any questions you have with your health care provider.   Document Released: 04/08/2005 Document Revised: 04/29/2014 Document Reviewed: 12/07/2012 Elsevier Interactive Patient Education 2016 [employer] Benign Prostatic Hyperplasia An enlarged prostate (benign prostatic hyperplasia) is common in older men. You may experience the following:  Weak urine stream.  Dribbling.  Feeling like the bladder has not emptied completely.  Difficulty starting urination.  Getting up frequently at night to urinate.  Urinating more frequently during the day. HOME CARE INSTRUCTIONS  Monitor your prostatic hyperplasia for any changes. The following actions may help to alleviate any discomfort you are experiencing:  Give yourself time when you urinate.  Stay away from alcohol.  Avoid beverages containing caffeine, such as coffee, tea, and colas, because they can make the problem worse.  Avoid decongestants, antihistamines, and some prescription medicines that can make the problem worse.  Follow up with your health care provider for further treatment as recommended. SEEK MEDICAL CARE IF:  You  are experiencing progressive difficulty voiding.  Your urine stream is progressively getting narrower.  You are awaking from sleep with the urge to void more frequently.  You are constantly feeling the need to void.  You experience loss of urine, especially in small amounts. SEEK IMMEDIATE MEDICAL CARE IF:    You develop increased pain with urination or are unable to urinate.  You develop severe abdominal pain, vomiting, a high fever, or fainting.  You develop back pain or blood in your urine. MAKE SURE YOU:   Understand these instructions.  Will watch your condition.  Will get help right away if you are not doing well or get worse.   This information is not intended to replace advice given to you by your health care provider. Make sure you discuss any questions you have with your health care provider.   Document Released: 04/08/2005 Document Revised: 04/29/2014 Document Reviewed: 09/08/2012 Elsevier Interactive Patient Education [employer].

## 2015-05-27 IMAGING — XA IR VASCULAR PROCEDURE
15 of 24 series · 15 of 24 positions shown · IV contrast (IODINE)
Comparison: none

[Series 1: care aorta · 1 of 2 slices shown]
[im 1/2]
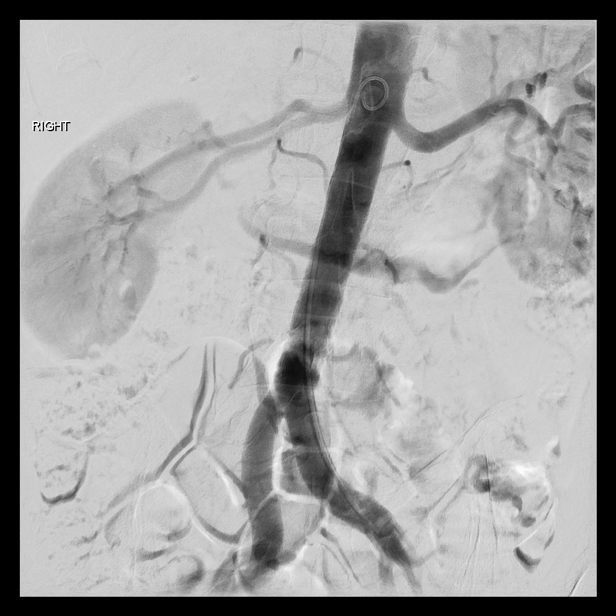

[Series 3: care sfa · 1 of 2 slices shown (1 of 11)]
[im 1/2]
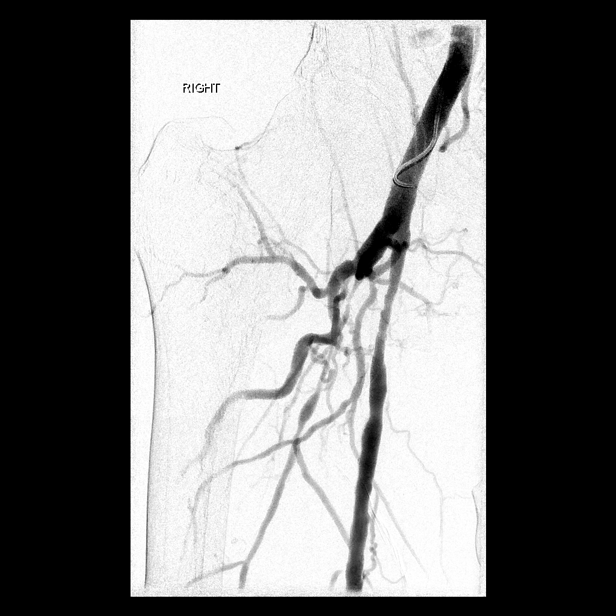

[Series 5: care sfa · 1 of 4 slices shown (2 of 11)]
[im 1/4]
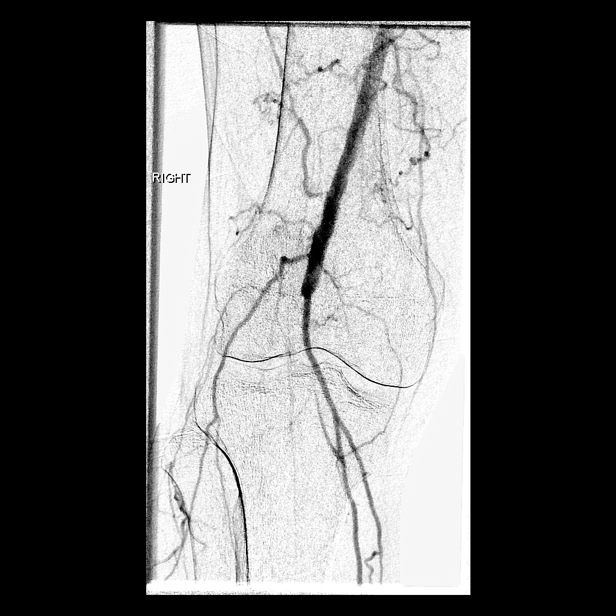

[Series 6: care sfa · 1 of 2 slices shown (3 of 11)]
[im 1/2]
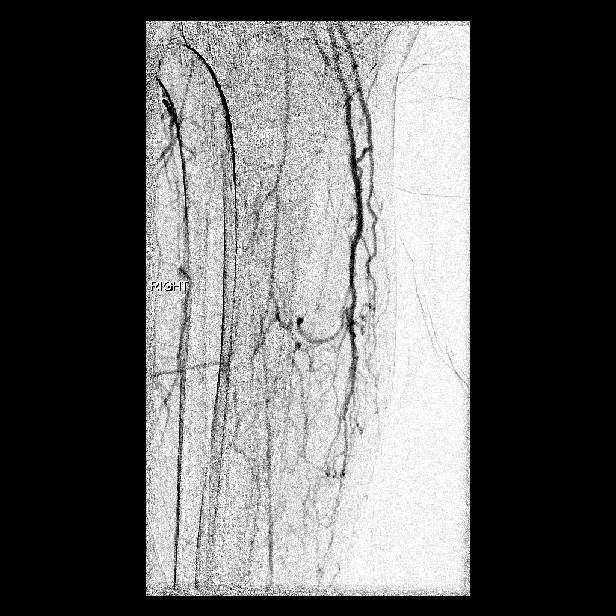

[Series 8: care sfa · 1 of 2 slices shown (4 of 11)]
[im 1/2]
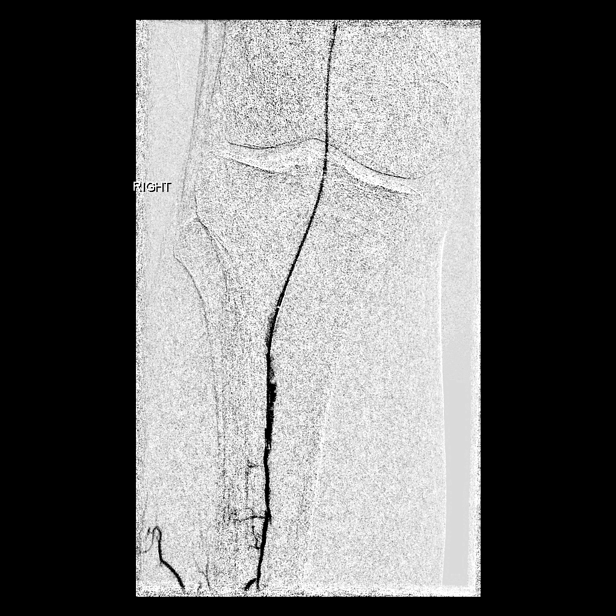

[Series 9: care sfa · 1 of 2 slices shown (5 of 11)]
[im 1/2]
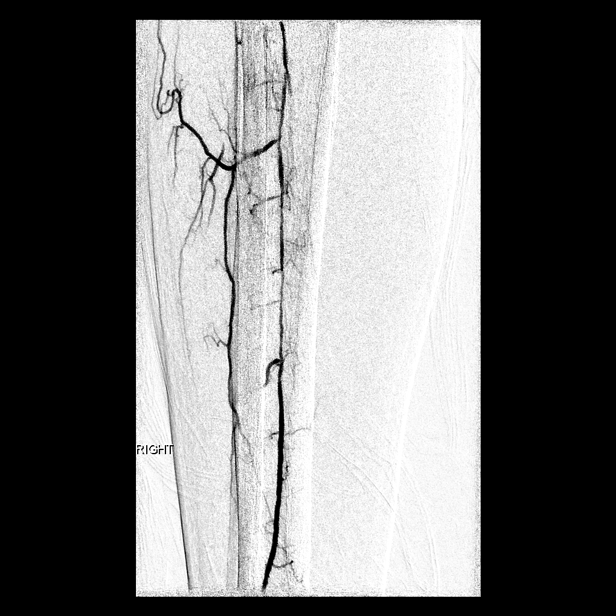

[Series 11: fl - angio · 1 of 1 slices shown (1 of 3)]
[im 1/1]
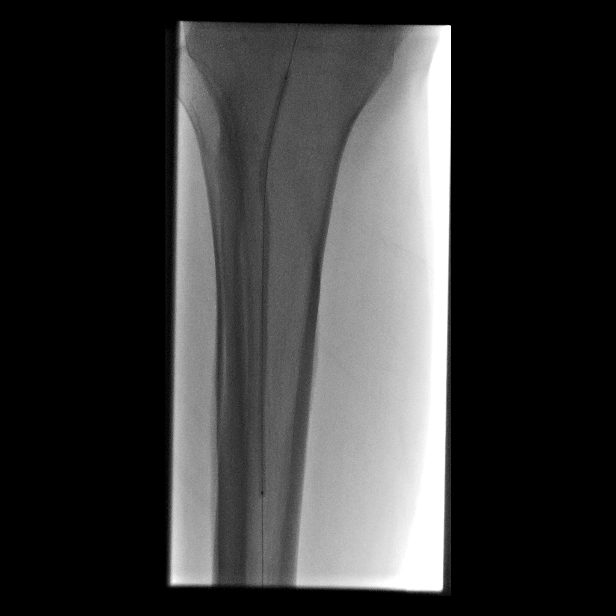

[Series 13: fl - angio · 1 of 1 slices shown (2 of 3)]
[im 1/1]
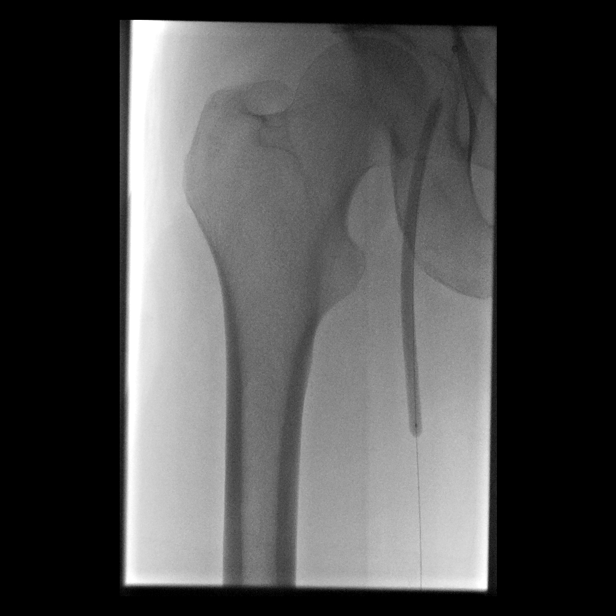

[Series 14: care sfa · 1 of 2 slices shown (6 of 11)]
[im 1/2]
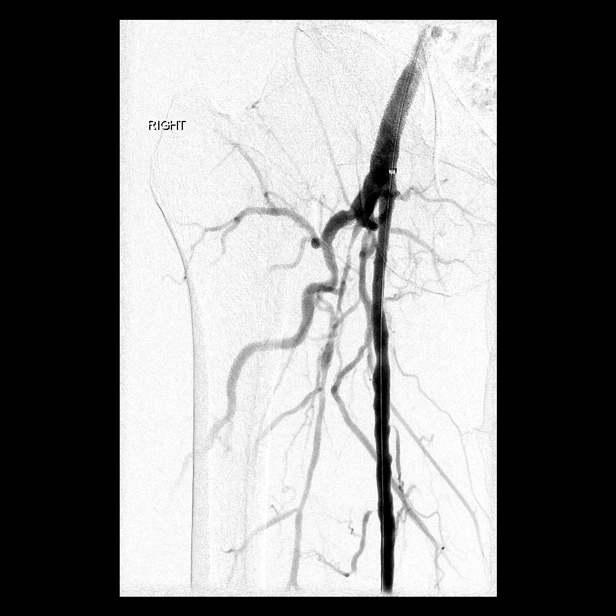

[Series 16: care sfa · 1 of 2 slices shown (7 of 11)]
[im 1/2]
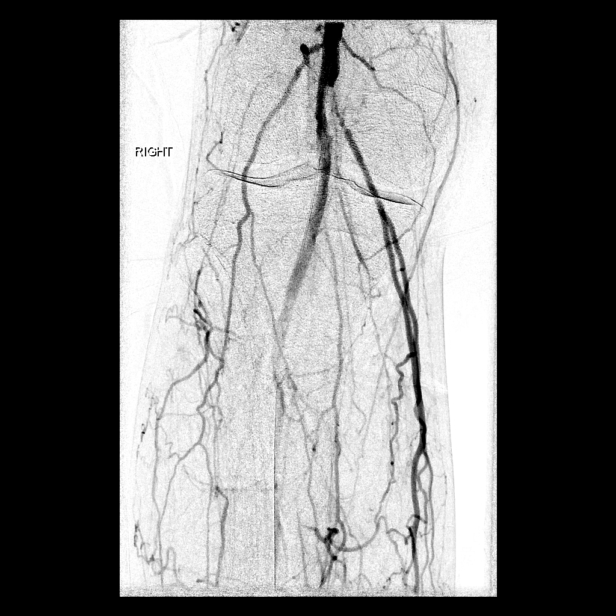

[Series 17: care sfa · 1 of 2 slices shown (8 of 11)]
[im 1/2]
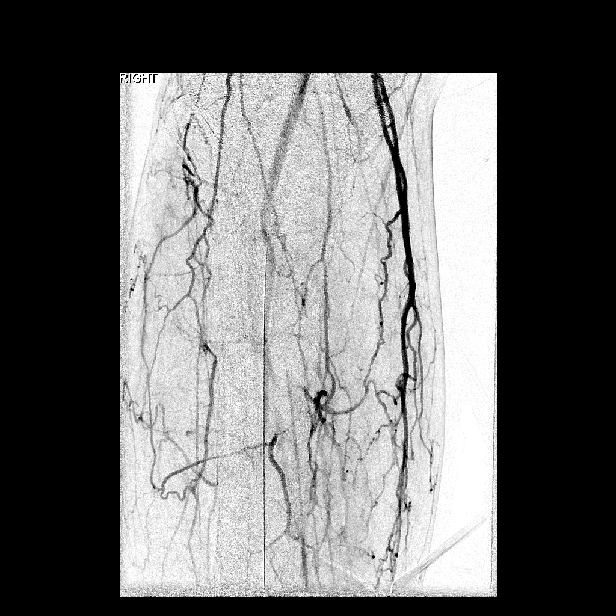

[Series 19: care sfa · 1 of 2 slices shown (9 of 11)]
[im 1/2]
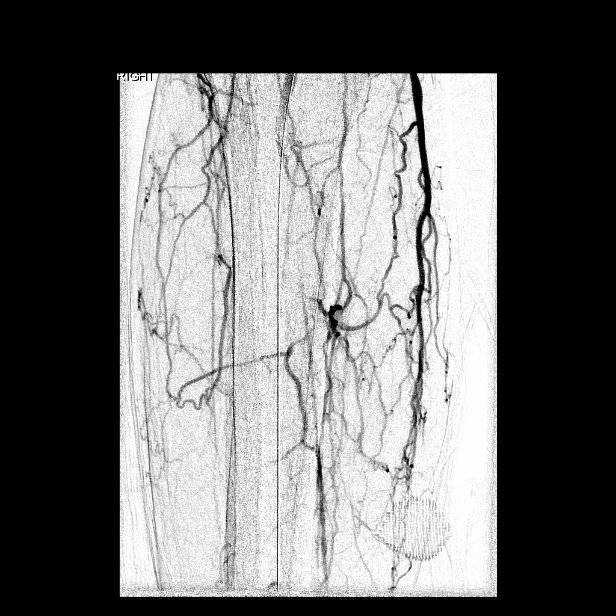

[Series 21: care sfa · 1 of 2 slices shown (10 of 11)]
[im 1/2]
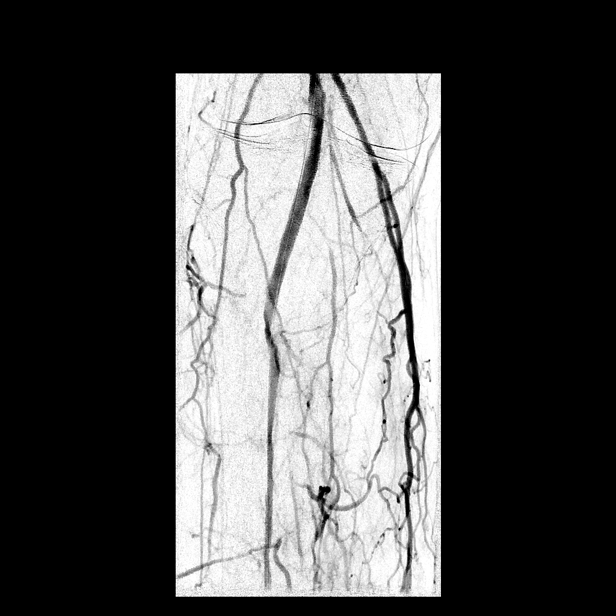

[Series 22: fl - angio · 1 of 1 slices shown (3 of 3)]
[im 1/1]
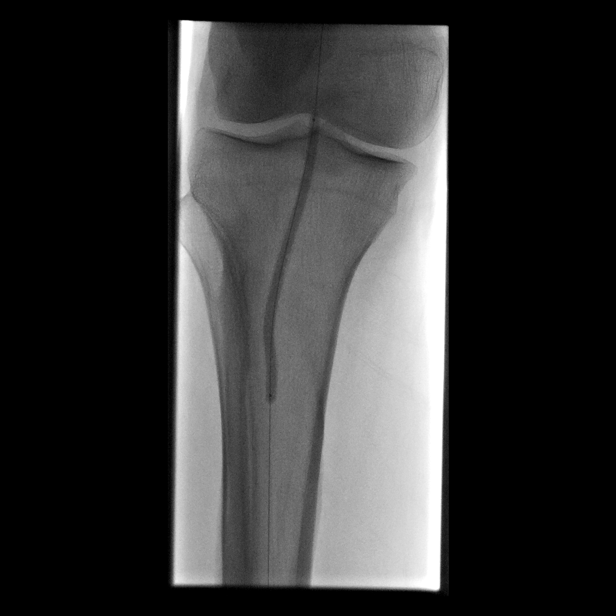

[Series 24: care sfa · 1 of 2 slices shown (11 of 11)]
[im 1/2]
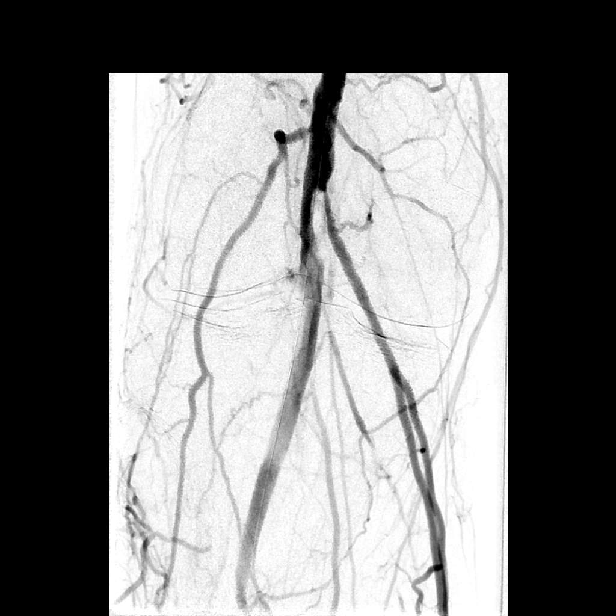

[15 of 24 positions shown; findings below may reference images not displayed]

IMAGES IMPORTED FROM THE [employer] WORKFLOW SYSTEM
NO DICTATION FOR STUDY

## 2015-12-17 ENCOUNTER — Emergency Department (HOSPITAL_BASED_OUTPATIENT_CLINIC_OR_DEPARTMENT_OTHER)
Admit: 2015-12-17 | Discharge: 2015-12-17 | Disposition: A | Discharge status: Home / Self Care | Payer: Medicare Other | Attending: Emergency Medicine | Admitting: Emergency Medicine

## 2015-12-17 ENCOUNTER — Encounter (HOSPITAL_COMMUNITY): Payer: Self-pay | Admitting: Emergency Medicine

## 2015-12-17 ENCOUNTER — Emergency Department (HOSPITAL_COMMUNITY)
Admission: EM | Admit: 2015-12-17 | Discharge: 2015-12-17 | Disposition: A | Discharge status: Home / Self Care | Payer: Medicare Other | Attending: Emergency Medicine | Admitting: Emergency Medicine

## 2015-12-17 DIAGNOSIS — M79662 Pain in left lower leg: Secondary | ICD-10-CM | POA: Diagnosis not present

## 2015-12-17 DIAGNOSIS — M79609 Pain in unspecified limb: Secondary | ICD-10-CM | POA: Diagnosis not present

## 2015-12-17 DIAGNOSIS — Z8551 Personal history of malignant neoplasm of bladder: Secondary | ICD-10-CM | POA: Diagnosis not present

## 2015-12-17 DIAGNOSIS — M79605 Pain in left leg: Secondary | ICD-10-CM

## 2015-12-17 NOTE — ED Notes (Signed)
Patient returned from [place].

## 2015-12-17 NOTE — ED Provider Notes (Signed)
[facility] DEPT Provider Note   CSN: [id number] Arrival date & time: 12/17/15  1042     History   Chief Complaint Chief Complaint  Patient presents with  . Leg Pain    HPI Donald Schmidt is a 65 y.o. male.  HPI Pt reports a left lower leg pain that started yesterday and has been intermittent coming and going but this morning getting worse. Pt reports history of PAD and has had stent in right lower leg.  Past Medical History:  Diagnosis Date  . Cancer (HCC)    BLADDER  . DVT (deep venous thrombosis) ([place])   . Pneumothorax 1977    Patient Active Problem List   Diagnosis Date Noted  . 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 Pain in limb 09/16/2012  . Atherosclerotic peripheral vascular disease with intermittent claudication ([place]) 09/16/2012  . Hematuria 04/28/2012  . Hamstring tear 04/28/2012  . MUSCLE STRAIN, RIGHT CALF 01/23/2010  . ALLERGIC RHINITIS DUE TO POLLEN 07/03/2007  . OTHER DISEASE OF PHARYNX OR NASOPHARYNX 04/08/2007  . FATIGUE 04/08/2007  . GERD 12/19/2006  . METATARSALGIA 12/19/2006    Past Surgical History:  Procedure Laterality Date  . BLADDER TUMOR EXCISION    . collasped lung    . CYSTOSCOPY         Home Medications    Prior to Admission medications   Medication Sig Start Date End Date Taking? Authorizing Provider  diphenhydrAMINE (BENADRYL) 25 mg capsule Take 25 mg by mouth every 6 (six) hours as needed for allergies.   Yes Historical Provider, MD  rosuvastatin (CRESTOR) 10 MG tablet Take 10 mg by mouth daily.   Yes Historical Provider, MD  ciprofloxacin (CIPRO) 500 MG tablet Take 1 tablet (500 mg total) by mouth 2 (two) times daily. Patient not taking: Reported on 12/17/2015 03/15/15   Roselee Culver, MD    Family History Family History  Problem Relation Age of Onset  . Hypertension Mother   . Hypertension Father   . Arrhythmia Father     Social History Social History  Substance Use Topics  . Smoking  status: Never Smoker  . Smokeless tobacco: Never Used  . Alcohol use 0.6 oz/week    1 Cans of beer per week     Comment: daily     Allergies   Penicillins   Review of Systems Review of Systems  All other systems reviewed and are negative Physical Exam Updated Vital Signs BP 136/75 (BP Location: Right Arm)   Pulse 66   Temp 97.7 F (36.5 C) (Oral)   Resp 16   Ht 5\' 10"  (1.778 m)   Wt 150 lb (68 kg)   SpO2 96%   BMI 21.52 kg/m   Physical Exam Physical Exam  Nursing note and vitals reviewed. Constitutional: He is oriented to person, place, and time. He appears well-developed and well-nourished. No distress.  HENT:  Head: Normocephalic and atraumatic.  Eyes: Pupils are equal, round, and reactive to light.  Neck: Normal range of motion.  Cardiovascular: Normal rate and intact distal pulses.   Pulmonary/Chest: No respiratory distress.  Abdominal: Normal appearance. He exhibits no distension.  Musculoskeletal: Normal range of motion.  tenderness to hyperextension and flexion of the left foot.  It does reproduce his pain.  Homans sign is negative.  Weak but palpable dorsalis pedis and posterior tibial pulses on the left.  Skin appears pink warm and dry. Neurological: He is alert and oriented to person, place, and time. No cranial  nerve deficit.  Skin: Skin is warm and dry. No rash noted.  Psychiatric: He has a normal mood and affect. His behavior is normal.    ED Treatments / Results  Labs (all labs ordered are listed, but only abnormal results are displayed) Labs Reviewed - No data to display  EKG  EKG Interpretation None       Radiology No results found.  Procedures Procedures (including critical care time)  Medications Ordered in ED Medications - No data to display   Initial Impression / Assessment and Plan / ED Course  I have reviewed the triage vital signs and the nursing notes.  Pertinent labs & imaging results that were available during my care of  the patient were reviewed by me and considered in my medical decision making (see chart for details).  Clinical Course  Arterial and venous Doppler of the left lower extremity revealed no DVT and no significant arterial blockage.    Final Clinical Impressions(s) / ED Diagnoses   Final diagnoses:  Left leg pain    New Prescriptions New Prescriptions   No medications on file     Leonard Schwartz, MD 12/17/15 1431

## 2015-12-17 NOTE — ED Triage Notes (Signed)
Pt reports a left lower leg pain that started yesterday and has been intermittent coming and going but this morning getting worse. Pt reports history of PAD and has had stent in right lower leg. Pt has pedal pulses +2. Foot is warm to touch. Cap refill > 3. Nail beds pink.

## 2015-12-17 NOTE — Progress Notes (Addendum)
*  PRELIMINARY RESULTS*  Left lower extremity venous duplex has been completed.  Preliminary findings: No evidence of DVT or baker's cyst.   ABI completed: Right ABI indicates mild arterial disease. Left ABI within normal limits. Appears essentially unchanged from ABI performed 09/16/12.    RIGHT    LEFT    PRESSURE WAVEFORM  PRESSURE WAVEFORM  BRACHIAL 148 Tri BRACHIAL 139 Tri  DP 99 Mono DP 159 Tri  PT 115 Mono PT 164 Tri    RIGHT LEFT  ABI 0.78 1.11     Landry Mellow, RDMS, RVT  12/17/2015, 12:41 PM

## 2023-02-03 ENCOUNTER — Other Ambulatory Visit (HOSPITAL_COMMUNITY): Payer: Self-pay | Admitting: Physician Assistant

## 2023-02-03 ENCOUNTER — Ambulatory Visit
Admission: RE | Admit: 2023-02-03 | Discharge: 2023-02-03 | Disposition: A | Discharge status: Home / Self Care | Payer: Medicare Other | Source: Ambulatory Visit | Attending: Physician Assistant | Admitting: Physician Assistant

## 2023-02-03 ENCOUNTER — Other Ambulatory Visit: Payer: Self-pay | Admitting: Physician Assistant

## 2023-02-03 DIAGNOSIS — M25532 Pain in left wrist: Secondary | ICD-10-CM

## 2023-02-03 DIAGNOSIS — E785 Hyperlipidemia, unspecified: Secondary | ICD-10-CM

## 2023-02-06 ENCOUNTER — Ambulatory Visit (HOSPITAL_COMMUNITY): Payer: Medicare Other

## 2023-02-10 ENCOUNTER — Ambulatory Visit (HOSPITAL_COMMUNITY)
Admission: RE | Admit: 2023-02-10 | Discharge: 2023-02-10 | Disposition: A | Discharge status: Home / Self Care | Payer: Medicare Other | Source: Ambulatory Visit | Attending: Physician Assistant | Admitting: Physician Assistant

## 2023-02-10 DIAGNOSIS — E785 Hyperlipidemia, unspecified: Secondary | ICD-10-CM | POA: Insufficient documentation

## 2024-02-10 ENCOUNTER — Other Ambulatory Visit (HOSPITAL_COMMUNITY): Payer: Self-pay | Admitting: Physician Assistant

## 2024-02-10 DIAGNOSIS — R42 Dizziness and giddiness: Secondary | ICD-10-CM

## 2024-02-19 ENCOUNTER — Other Ambulatory Visit: Payer: Self-pay

## 2024-02-19 ENCOUNTER — Ambulatory Visit: Attending: Physician Assistant | Admitting: Physical Therapy

## 2024-02-19 DIAGNOSIS — R42 Dizziness and giddiness: Secondary | ICD-10-CM | POA: Insufficient documentation

## 2024-02-19 DIAGNOSIS — R2681 Unsteadiness on feet: Secondary | ICD-10-CM | POA: Insufficient documentation

## 2024-02-19 NOTE — Therapy (Signed)
 OUTPATIENT PHYSICAL THERAPY VESTIBULAR EVALUATION     Patient Name: Donald Schmidt MRN: [id number] DOB:[birth year], 73 y.o., male Today's Date: 02/20/2024  END OF SESSION:  PT End of Session - 02/20/24 1146     Visit Number 1    Number of Visits 8    Date for Recertification  04/09/24    Authorization Type Medicare    PT Start Time 0850    PT Stop Time 0930    PT Time Calculation (min) 40 min    Activity Tolerance Patient tolerated treatment well    Behavior During Therapy [facility] for tasks assessed/performed          Past Medical History:  Diagnosis Date   Cancer (HCC)    BLADDER   DVT (deep venous thrombosis) (HCC)    Pneumothorax 1977   Past Surgical History:  Procedure Laterality Date   BLADDER TUMOR EXCISION     collasped lung     CYSTOSCOPY     Patient Active Problem List   Diagnosis Date Noted   Popliteal artery occlusion, 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   Pain in limb 09/16/2012   Atherosclerotic peripheral vascular disease with intermittent claudication 09/16/2012   Hematuria 04/28/2012   Hamstring tear 04/28/2012   MUSCLE STRAIN, RIGHT CALF 01/23/2010   ALLERGIC RHINITIS DUE TO POLLEN 07/03/2007   OTHER DISEASE OF PHARYNX OR NASOPHARYNX 04/08/2007   FATIGUE 04/08/2007   GERD 12/19/2006   METATARSALGIA 12/19/2006    PCP: Patient, No Pcp Per REFERRING PROVIDER: Stacia, Alexus, PA   REFERRING DIAG: R42 (ICD-10-CM) - Dizziness and giddiness   THERAPY DIAG:  Dizziness and giddiness  Unsteadiness on feet  ONSET DATE: 01/27/2024 MD referral  Rationale for Evaluation and Treatment: Rehabilitation  SUBJECTIVE:   SUBJECTIVE STATEMENT: Dizziness started last spring, noticed a little bit.  Felt a little shifty with walking, with pickleball at times.  Once had a spinning episode while driving.  Feels like now it's on the decline.  Sometimes feel a little woozy-maybe equilibrium issue. Pt accompanied by: self  PERTINENT HISTORY: wrist fracture  PAIN:  Are  you having pain? No  PRECAUTIONS: None  RED FLAGS: None   WEIGHT BEARING RESTRICTIONS: No  FALLS: Has patient fallen in last 6 months? No  LIVING ENVIRONMENT: Lives with: lives with their spouse Lives in: House/apartment  PLOF: IndependentEnjoys swimming, biking, pickleball  PATIENT GOALS: Not feel dizzy anymore  OBJECTIVE:  Note: Objective measures were completed at Evaluation unless otherwise noted.  DIAGNOSTIC FINDINGS: NA for this episode  COGNITION: Overall cognitive status: Within functional limits for tasks assessed    POSTURE:  rounded shoulders  Cervical ROM:  WFL A/ROM  BED MOBILITY:  Independent  TRANSFERS: Assistive device utilized: None  Sit to stand: Modified independence Stand to sit: Modified independence  GAIT: Gait pattern: step through pattern Distance walked: 50 ft Assistive device utilized: None Level of assistance: Complete Independence   PATIENT SURVEYS:  DHI: THE DIZZINESS HANDICAP INVENTORY (DHI)  P1. Does looking up increase your problem? 0 = No  E2. Because of your problem, do you feel frustrated? 4 = Yes  F3. Because of your problem, do you restrict your travel for business or recreation?  2 = Sometimes  P4. Does walking down the aisle of a supermarket increase your problems?  0 = No  F5. Because of your problem, do you have difficulty getting into or out of bed?  0 = No  F6. Does your problem significantly restrict your participation in social activities, such as  going out to dinner, going to the movies, dancing, or going to parties? 0 = No  F7. Because of your problem, do you have difficulty reading?  0 = No  P8. Does performing more ambitious activities such as sports, dancing, household chores (sweeping or putting dishes away) increase your problems?  0 = No  E9. Because of your problem, are you afraid to leave your home without having without having someone accompany you?  0 = No  E10. Because of your problem have you been  embarrassed in front of others?  0 = No  P11. Do quick movements of your head increase your problem?  0 = No  F12. Because of your problem, do you avoid heights?  0 = No  P13. Does turning over in bed increase your problem?  0 = No  F14. Because of your problem, is it difficult for you to do strenuous homework or yard work? 2 = Sometimes  E15. Because of your problem, are you afraid people may think you are intoxicated? 0 = No  F16. Because of your problem, is it difficult for you to go for a walk by yourself?  2 = Sometimes  P17. Does walking down a sidewalk increase your problem?  0 = No  E18.Because of your problem, is it difficult for you to concentrate 0 = No  F19. Because of your problem, is it difficult for you to walk around your house in the dark? 0 = No  E20. Because of your problem, are you afraid to stay home alone?  0 = No  E21. Because of your problem, do you feel handicapped? 0 = No  E22. Has the problem placed stress on your relationships with members of your family or friends? 0 = No  E23. Because of your problem, are you depressed?  0 = No  F24. Does your problem interfere with your job or household responsibilities?  0 = No  P25. Does bending over increase your problem?  0 = No  TOTAL 10    DHI Scoring Instructions  The patient is asked to answer each question as it pertains to dizziness or unsteadiness problems, specifically  considering their condition during the last month. Questions are designed to incorporate functional (F), physical  (P), and emotional (E) impacts on disability.   Scores greater than 10 points should be referred to balance specialists for further evaluation.   16-34 Points (mild handicap)  36-52 Points (moderate handicap)  54+ Points (severe handicap)  Minimally Detectable Change: 17 points ([address] [place], 1990)  Goltry, G. SHAUNNA. and Susan Moore, C. W. (1990). The development of the Dizziness Handicap Inventory. Archives of Otolaryngology -  Head and Neck Surgery 116(4): [id number].   VESTIBULAR ASSESSMENT:  GENERAL OBSERVATION: No acute   SYMPTOM BEHAVIOR:  Subjective history: Dizziness started last spring. Felt a little shifty with walking, with pickleball at times.  Once had a spinning episode while driving.  Feels like now it's on the decline.  Sometimes feel a little woozy-maybe equilibrium issue.  Non-Vestibular symptoms: neck pain, tinnitus, and reports tinnitus is not new (>10 years)  Type of dizziness: Imbalance (Disequilibrium), Spinning/Vertigo, Unsteady with head/body turns, and Lightheadedness/Faint  Frequency: every other week  Duration: passes within seconds  Aggravating factors: Induced by position change: sit to stand, Induced by motion: driving and after doing weights, and sometimes worse in the morning; reports happened once with driving, once with hiking  Relieving factors: work through it  Progression of symptoms: better  OCULOMOTOR EXAM:wears progressive lenses  Ocular Alignment: normal  Ocular ROM: No Limitations  Spontaneous Nystagmus: absent  Gaze-Induced Nystagmus: absent  Smooth Pursuits: intact  Saccades: intact  Convergence/Divergence: 2 cm   Cover-cross-cover test: NT  VESTIBULAR - OCULAR REFLEX:   Slow VOR: Normal  VOR Cancellation: Normal  Head-Impulse Test: HIT Right: negative HIT Left: negative  Dynamic Visual Acuity: NT   POSITIONAL TESTING: Right Dix-Hallpike: no nystagmus and no symptoms Left Dix-Hallpike: no nystagmus and no symptoms Right Roll Test: no nystagmus and no symptoms Left Roll Test: no nystagmus and no symptoms Saccadic eye movements noted coming in and out of roll tests, asymptomatic MOTION SENSITIVITY: NOT TESTED AT EVAL  Motion Sensitivity Quotient Intensity: 0 = none, 1 = Lightheaded, 2 = Mild, 3 = Moderate, 4 = Severe, 5 = Vomiting  Intensity  1. Sitting to supine   2. Supine to L side   3. Supine to R side   4. Supine to sitting   5. L Hallpike-Dix   6.  Up from L    7. R Hallpike-Dix   8. Up from R    9. Sitting, head tipped to L knee   10. Head up from L knee   11. Sitting, head tipped to R knee   12. Head up from R knee   13. Sitting head turns x5   14.Sitting head nods x5   15. In stance, 180 turn to L    16. In stance, 180 turn to R     OTHOSTATICS: not done  FUNCTIONAL GAIT: FGA:  26/30   M-CTSIB  Condition 1: Firm Surface, EO 30 Sec, Normal Sway  Condition 2: Firm Surface, EC 30 Sec, Mild Sway  Condition 3: Foam Surface, EO 30 Sec, Mild Sway  Condition 4: Foam Surface, EC 5 Sec, Severe Sway    [facility] PT Assessment - 02/19/24 0924       Functional Gait  Assessment   Gait assessed  Yes    Gait Level Surface Walks 20 ft in less than 7 sec but greater than 5.5 sec, uses assistive device, slower speed, mild gait deviations, or deviates 6-10 in outside of the 12 in walkway width.   5.9   Change in Gait Speed Able to smoothly change walking speed without loss of balance or gait deviation. Deviate no more than 6 in outside of the 12 in walkway width.    Gait with Horizontal Head Turns Performs head turns smoothly with slight change in gait velocity (eg, minor disruption to smooth gait path), deviates 6-10 in outside 12 in walkway width, or uses an assistive device.    Gait with Vertical Head Turns Performs task with slight change in gait velocity (eg, minor disruption to smooth gait path), deviates 6 - 10 in outside 12 in walkway width or uses assistive device    Gait and Pivot Turn Pivot turns safely within 3 sec and stops quickly with no loss of balance.    Step Over Obstacle Is able to step over 2 stacked shoe boxes taped together (9 in total height) without changing gait speed. No evidence of imbalance.    Gait with Narrow Base of Support Is able to ambulate for 10 steps heel to toe with no staggering.    Gait with Eyes Closed Walks 20 ft, uses assistive device, slower speed, mild gait deviations, deviates 6-10 in outside 12 in  walkway width. Ambulates 20 ft in less than 9 sec but greater than 7 sec.  7.7   Ambulating Backwards Walks 20 ft, no assistive devices, good speed, no evidence for imbalance, normal gait    Steps Alternating feet, no rail.    Total Score 26                                                                                                                                     TREATMENT DATE: 02/19/2024    PATIENT EDUCATION: Education details: Eval results, POC, initiated HEP Person educated: Patient Education method: Explanation, Demonstration, and Handouts Education comprehension: verbalized understanding, returned demonstration, and needs further education  HOME EXERCISE PROGRAM: Access Code: [email or web address] URL: [email or web address] Date: 02/19/2024 Prepared by: [facility] - Outpatient  Rehab - [facility]  Exercises - Corner Balance Feet Together With Eyes Open  - 1-2 x daily - 7 x weekly - 3 sets - 10 reps - Corner Balance Feet Together With Eyes Closed  - 1-2 x daily - 7 x weekly - 3 sets - 10 reps GOALS: Goals reviewed with patient? Yes  SHORT TERM GOALS: = LONG TERM GOALS: Target date: 04/09/2024  Pt will be independent with HEP for improved balance, dizziness. Baseline:  Goal status: INITIAL  2.  Pt will report 50% improvement in [facility] for decreased dizziness impacting daily activities. Baseline: 10 Goal status: INITIAL  3.  Pt will perform Condition 4 on MCTSIB 30 seconds with mod sway or better, for improved vestibular system use for balance. Baseline: 5 sec severe sway Goal status: INITIAL    ASSESSMENT:  CLINICAL IMPRESSION: Patient is a 73 y.o. male who was seen today for physical therapy evaluation and treatment for dizziness and giddiness. He reports dizziness began in the spring, and mostly it is shifty, equilibrium related, though he did have one episode of spinning sensation while driving, causing him to stop.  He notes worse in the  mornings, and with changing position to stand up, but does not note bed mobility bringing on symptoms.  Oculomotor testing and VOR testing is [facility] today; positional testing does not bring on symptoms and no nystagmus noted, though he does have saccadic eye movements coming from roll positions back to midline (no symptoms, though).  He has decreased vestibular system use for balance, with being able to balance once 5 sec on Condition 4 of MCTSIB.  He is very active with exercise daily, including pickleball; he does not report any illness, head injury, or hx of migraines.  He will benefit from skilled PT to address decrease vestibular system use for balance, which is also noted with slowed pace/slight increase in disequilibrium symptoms with head motions, EC on FGA.    OBJECTIVE IMPAIRMENTS: decreased balance and dizziness.   ACTIVITY LIMITATIONS: locomotion level  PARTICIPATION LIMITATIONS: driving, shopping, community activity, and fitness  PERSONAL FACTORS: 3+ comorbidities: see above are also affecting patient's functional outcome.   REHAB POTENTIAL: Good  CLINICAL DECISION MAKING: Stable/uncomplicated  EVALUATION COMPLEXITY: Low  PLAN:  PT FREQUENCY: 1x/week  PT DURATION: 8 weeks including eval visit  PLANNED INTERVENTIONS: 97750- Physical Performance Testing, 97110-Therapeutic exercises, 97530- Therapeutic activity, [id number]- Neuromuscular re-education, 97535- Self Care, [id number]- Manual therapy, [phone]- Gait training, [phone]- Canalith repositioning, Patient/Family education, Balance training, and Vestibular training  PLAN FOR NEXT SESSION: Review HEP and progress for multi-sensory balance; reassess BPPV and treat as needed.   STARLET GREIG ORN., PT 02/20/2024, 11:47 AM   [facility] at [facility] [address] [place], [address] [place], [place] [address] Phone # [phone] Fax # [phone]

## 2024-02-26 ENCOUNTER — Other Ambulatory Visit (HOSPITAL_BASED_OUTPATIENT_CLINIC_OR_DEPARTMENT_OTHER): Payer: Self-pay | Admitting: Physician Assistant

## 2024-02-26 DIAGNOSIS — M810 Age-related osteoporosis without current pathological fracture: Secondary | ICD-10-CM

## 2024-03-03 NOTE — Therapy (Signed)
 OUTPATIENT PHYSICAL THERAPY VESTIBULAR DISCHARGE     Patient Name: Donald Schmidt MRN: [id number] DOB:1950-09-14, 73 y.o., male Today's Date: 03/04/2024   Progress Note Reporting Period 02/19/24 to 03/04/24  See note below for Objective Data and Assessment of Progress/Goals.     END OF SESSION:  PT End of Session - 03/04/24 0913     Visit Number 2    Number of Visits 8    Date for Recertification  04/09/24    Authorization Type Medicare    PT Start Time 0848    PT Stop Time 0912    PT Time Calculation (min) 24 min    Equipment Utilized During Treatment Gait belt    Activity Tolerance Patient tolerated treatment well    Behavior During Therapy WFL for tasks assessed/performed           Past Medical History:  Diagnosis Date   Cancer (HCC)    BLADDER   DVT (deep venous thrombosis) (HCC)    Pneumothorax 1977   Past Surgical History:  Procedure Laterality Date   BLADDER TUMOR EXCISION     collasped lung     CYSTOSCOPY     Patient Active Problem List   Diagnosis Date Noted   Popliteal artery occlusion, 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   Pain in limb 09/16/2012   Atherosclerotic peripheral vascular disease with intermittent claudication 09/16/2012   Hematuria 04/28/2012   Hamstring tear 04/28/2012   MUSCLE STRAIN, RIGHT CALF 01/23/2010   ALLERGIC RHINITIS DUE TO POLLEN 07/03/2007   OTHER DISEASE OF PHARYNX OR NASOPHARYNX 04/08/2007   FATIGUE 04/08/2007   GERD 12/19/2006   METATARSALGIA 12/19/2006    PCP: Patient, No Pcp Per REFERRING PROVIDER: Stacia, Alexus, PA   REFERRING DIAG: R42 (ICD-10-CM) - Dizziness and giddiness   THERAPY DIAG:  Dizziness and giddiness  Unsteadiness on feet  ONSET DATE: 01/27/2024 MD referral  Rationale for Evaluation and Treatment: Rehabilitation  SUBJECTIVE:   SUBJECTIVE STATEMENT: I haven't experienced dizziness for the last few weeks. Started taking iron pills but labs did not show an iron deficiency. Played 2 hours of  pickleball yesterday without issue. Rode my bike here. Denies balance concerns.   Pt accompanied by: self  PERTINENT HISTORY: wrist fracture  PAIN:  Are you having pain? No  PRECAUTIONS: None  RED FLAGS: None   WEIGHT BEARING RESTRICTIONS: No  FALLS: Has patient fallen in last 6 months? No  LIVING ENVIRONMENT: Lives with: lives with their spouse Lives in: House/apartment  PLOF: IndependentEnjoys swimming, biking, pickleball  PATIENT GOALS: Not feel dizzy anymore  OBJECTIVE:    TODAY'S TREATMENT: 03/04/24   M-CTSIB  Condition 1: Firm Surface, EO 30 Sec, Normal Sway  Condition 2: Firm Surface, EC 30 Sec, Mild and Moderate Sway  Condition 3: Foam Surface, EO 30 Sec, Normal and Mild Sway  Condition 4: Foam Surface, EC 30 Sec, Moderate Sway      Activity Comments  Standing on foam EC + head turns/nods Mild-mod sway   DHI 24/100                HOME EXERCISE PROGRAM Last updated: 03/04/24 Access Code: [email or web address] URL: [email or web address] Date: 03/04/2024 Prepared by: [facility] - Outpatient  Rehab - [facility]  Exercises - Romberg Stance Eyes Closed on Foam Pad  - 1 x daily - 5 x weekly - 2 sets - 30 sec hold - Wide Stance with Eyes Closed and Head Rotation on Foam Pad  - 1 x daily - 5 x weekly -  2 sets - 30 sec hold - Wide Stance with Eyes Closed and Head Nods on Foam Pad  - 1 x daily - 5 x weekly - 2 sets - 30 sec hold  PATIENT EDUCATION: Education details: edu on exam findings, edu on multisensory balance, HEP update , DC with possibility to return with new referral Person educated: Patient Education method: Explanation, Demonstration, Tactile cues, Verbal cues, and Handouts Education comprehension: verbalized understanding and returned demonstration      Note: Objective measures were completed at Evaluation unless otherwise noted.  DIAGNOSTIC FINDINGS: NA for this episode  COGNITION: Overall cognitive status: Within  functional limits for tasks assessed    POSTURE:  rounded shoulders  Cervical ROM:  WFL A/ROM  BED MOBILITY:  Independent  TRANSFERS: Assistive device utilized: None  Sit to stand: Modified independence Stand to sit: Modified independence  GAIT: Gait pattern: step through pattern Distance walked: 50 ft Assistive device utilized: None Level of assistance: Complete Independence   PATIENT SURVEYS:  DHI: THE DIZZINESS HANDICAP INVENTORY (DHI)  P1. Does looking up increase your problem? 0 = No  E2. Because of your problem, do you feel frustrated? 4 = Yes  F3. Because of your problem, do you restrict your travel for business or recreation?  2 = Sometimes  P4. Does walking down the aisle of a supermarket increase your problems?  0 = No  F5. Because of your problem, do you have difficulty getting into or out of bed?  0 = No  F6. Does your problem significantly restrict your participation in social activities, such as going out to dinner, going to the movies, dancing, or going to parties? 0 = No  F7. Because of your problem, do you have difficulty reading?  0 = No  P8. Does performing more ambitious activities such as sports, dancing, household chores (sweeping or putting dishes away) increase your problems?  0 = No  E9. Because of your problem, are you afraid to leave your home without having without having someone accompany you?  0 = No  E10. Because of your problem have you been embarrassed in front of others?  0 = No  P11. Do quick movements of your head increase your problem?  0 = No  F12. Because of your problem, do you avoid heights?  0 = No  P13. Does turning over in bed increase your problem?  0 = No  F14. Because of your problem, is it difficult for you to do strenuous homework or yard work? 2 = Sometimes  E15. Because of your problem, are you afraid people may think you are intoxicated? 0 = No  F16. Because of your problem, is it difficult for you to go for a walk by  yourself?  2 = Sometimes  P17. Does walking down a sidewalk increase your problem?  0 = No  E18.Because of your problem, is it difficult for you to concentrate 0 = No  F19. Because of your problem, is it difficult for you to walk around your house in the dark? 0 = No  E20. Because of your problem, are you afraid to stay home alone?  0 = No  E21. Because of your problem, do you feel handicapped? 0 = No  E22. Has the problem placed stress on your relationships with members of your family or friends? 0 = No  E23. Because of your problem, are you depressed?  0 = No  F24. Does your problem interfere with your job or household  responsibilities?  0 = No  P25. Does bending over increase your problem?  0 = No  TOTAL 10    DHI Scoring Instructions  The patient is asked to answer each question as it pertains to dizziness or unsteadiness problems, specifically  considering their condition during the last month. Questions are designed to incorporate functional (F), physical  (P), and emotional (E) impacts on disability.   Scores greater than 10 points should be referred to balance specialists for further evaluation.   16-34 Points (mild handicap)  36-52 Points (moderate handicap)  54+ Points (severe handicap)  Minimally Detectable Change: 17 points ([address] [place], 1990)  Monon, G. SHAUNNA. and Rinard, C. W. (1990). The development of the Dizziness Handicap Inventory. Archives of Otolaryngology - Head and Neck Surgery 116(4): [id number].   VESTIBULAR ASSESSMENT:  GENERAL OBSERVATION: No acute   SYMPTOM BEHAVIOR:  Subjective history: Dizziness started last spring. Felt a little shifty with walking, with pickleball at times.  Once had a spinning episode while driving.  Feels like now it's on the decline.  Sometimes feel a little woozy-maybe equilibrium issue.  Non-Vestibular symptoms: neck pain, tinnitus, and reports tinnitus is not new (>10 years)  Type of dizziness: Imbalance (Disequilibrium),  Spinning/Vertigo, Unsteady with head/body turns, and Lightheadedness/Faint  Frequency: every other week  Duration: passes within seconds  Aggravating factors: Induced by position change: sit to stand, Induced by motion: driving and after doing weights, and sometimes worse in the morning; reports happened once with driving, once with hiking  Relieving factors: work through it  Progression of symptoms: better  OCULOMOTOR EXAM:wears progressive lenses  Ocular Alignment: normal  Ocular ROM: No Limitations  Spontaneous Nystagmus: absent  Gaze-Induced Nystagmus: absent  Smooth Pursuits: intact  Saccades: intact  Convergence/Divergence: 2 cm   Cover-cross-cover test: NT  VESTIBULAR - OCULAR REFLEX:   Slow VOR: Normal  VOR Cancellation: Normal  Head-Impulse Test: HIT Right: negative HIT Left: negative  Dynamic Visual Acuity: NT   POSITIONAL TESTING: Right Dix-Hallpike: no nystagmus and no symptoms Left Dix-Hallpike: no nystagmus and no symptoms Right Roll Test: no nystagmus and no symptoms Left Roll Test: no nystagmus and no symptoms Saccadic eye movements noted coming in and out of roll tests, asymptomatic MOTION SENSITIVITY: NOT TESTED AT EVAL  Motion Sensitivity Quotient Intensity: 0 = none, 1 = Lightheaded, 2 = Mild, 3 = Moderate, 4 = Severe, 5 = Vomiting  Intensity  1. Sitting to supine   2. Supine to L side   3. Supine to R side   4. Supine to sitting   5. L Hallpike-Dix   6. Up from L    7. R Hallpike-Dix   8. Up from R    9. Sitting, head tipped to L knee   10. Head up from L knee   11. Sitting, head tipped to R knee   12. Head up from R knee   13. Sitting head turns x5   14.Sitting head nods x5   15. In stance, 180 turn to L    16. In stance, 180 turn to R     OTHOSTATICS: not done  FUNCTIONAL GAIT: FGA:  26/30   M-CTSIB  Condition 1: Firm Surface, EO 30 Sec, Normal Sway  Condition 2: Firm Surface, EC 30 Sec, Mild Sway  Condition 3: Foam Surface, EO 30  Sec, Mild Sway  Condition 4: Foam Surface, EC 5 Sec, Severe Sway  TREATMENT DATE: 02/19/2024    PATIENT EDUCATION: Education details: Eval results, POC, initiated HEP Person educated: Patient Education method: Explanation, Demonstration, and Handouts Education comprehension: verbalized understanding, returned demonstration, and needs further education  HOME EXERCISE PROGRAM: Access Code: [email or web address] URL: [email or web address] Date: 02/19/2024 Prepared by: [id number] [facility] - Outpatient  Rehab - [facility]  Exercises - Corner Balance Feet Together With Eyes Open  - 1-2 x daily - 7 x weekly - 3 sets - 10 reps - Corner Balance Feet Together With Eyes Closed  - 1-2 x daily - 7 x weekly - 3 sets - 10 reps GOALS: Goals reviewed with patient? Yes  SHORT TERM GOALS: = LONG TERM GOALS: Target date: 04/09/2024  Pt will be independent with HEP for improved balance, dizziness. Baseline:  Goal status: MET  2.  Pt will report 50% improvement in [facility] for decreased dizziness impacting daily activities. Baseline: 10; 12 Goal status: NOT MET   3.  Pt will perform Condition 4 on MCTSIB 30 seconds with mod sway or better, for improved vestibular system use for balance. Baseline: 5 sec severe sway; 30 sec moderate sway 03/04/24  Goal status: MET 03/04/24    ASSESSMENT:  CLINICAL IMPRESSION: Patient arrived to session with report of resolution of dizziness and return to pickleball without concerns. Session focused on goals check which revealed improved multisensory balance. HEP was progressed to provide additional challenges safely. Patient scored 24/100 on DHI, which is higher than baseline score despite report of resolution of dizziness. Patient has returned to pickleball and riding his bike without symptoms, thus ready for DC today and welcome to return  with new referral if sx reoccur.    OBJECTIVE IMPAIRMENTS: decreased balance and dizziness.   ACTIVITY LIMITATIONS: locomotion level  PARTICIPATION LIMITATIONS: driving, shopping, community activity, and fitness  PERSONAL FACTORS: 3+ comorbidities: see above are also affecting patient's functional outcome.   REHAB POTENTIAL: Good  CLINICAL DECISION MAKING: Stable/uncomplicated  EVALUATION COMPLEXITY: Low   PLAN:  PT FREQUENCY: 1x/week  PT DURATION: 8 weeks including eval visit  PLANNED INTERVENTIONS: 97750- Physical Performance Testing, 97110-Therapeutic exercises, 97530- Therapeutic activity, 97112- Neuromuscular re-education, 97535- Self Care, [id number]- Manual therapy, [phone]- Gait training, [phone]- Canalith repositioning, Patient/Family education, Balance training, and Vestibular training  PLAN FOR NEXT SESSION: DC at this time  PHYSICAL THERAPY DISCHARGE SUMMARY  Visits from Start of Care: 2  Current functional level related to goals / functional outcomes: See above clinical impression   Remaining deficits: Some imbalance   Education / Equipment: HEP  Plan: Patient agrees to discharge.  Patient goals were partially met. Patient is being discharged due to meeting the stated rehab goals.         Louana Terrilyn Christians, PT, DPT 03/04/24 9:16 AM  [facility] at [facility] [address] [place], [address] [place], [place] [address] Phone # [phone] Fax # [phone]

## 2024-03-04 ENCOUNTER — Encounter: Payer: Self-pay | Admitting: Physical Therapy

## 2024-03-04 ENCOUNTER — Ambulatory Visit: Attending: Physician Assistant | Admitting: Physical Therapy

## 2024-03-04 DIAGNOSIS — R2681 Unsteadiness on feet: Secondary | ICD-10-CM | POA: Insufficient documentation

## 2024-03-04 DIAGNOSIS — R42 Dizziness and giddiness: Secondary | ICD-10-CM | POA: Insufficient documentation

## 2024-03-10 ENCOUNTER — Ambulatory Visit: Admitting: Physical Therapy

## 2024-08-30 ENCOUNTER — Other Ambulatory Visit (HOSPITAL_BASED_OUTPATIENT_CLINIC_OR_DEPARTMENT_OTHER): Payer: PRIVATE HEALTH INSURANCE
